# Patient Record
Sex: Female | Born: 1990 | Race: White | Hispanic: No | Marital: Single | Smoking: Current some day smoker
Health system: Southern US, Community
[De-identification: ages and names within clinical notes are randomized; demographics above are authoritative.]

## PROBLEM LIST (undated history)

## (undated) DIAGNOSIS — R000 Tachycardia, unspecified: Secondary | ICD-10-CM

## (undated) DIAGNOSIS — E0590 Thyrotoxicosis, unspecified without thyrotoxic crisis or storm: Secondary | ICD-10-CM

## (undated) DIAGNOSIS — N3010 Interstitial cystitis (chronic) without hematuria: Secondary | ICD-10-CM

## (undated) DIAGNOSIS — F410 Panic disorder [episodic paroxysmal anxiety] without agoraphobia: Secondary | ICD-10-CM

## (undated) DIAGNOSIS — R0602 Shortness of breath: Secondary | ICD-10-CM

## (undated) HISTORY — DX: Shortness of breath: R06.02

## (undated) HISTORY — PX: EXTERNAL EAR SURGERY: SHX627

## (undated) HISTORY — PX: BREAST ENHANCEMENT SURGERY: SHX7

## (undated) HISTORY — DX: Tachycardia, unspecified: R00.0

## (undated) HISTORY — PX: RHINOPLASTY: SHX2354

## (undated) HISTORY — DX: Thyrotoxicosis, unspecified without thyrotoxic crisis or storm: E05.90

## (undated) HISTORY — DX: Panic disorder (episodic paroxysmal anxiety): F41.0

---
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
## 2010-01-02 ENCOUNTER — Encounter (HOSPITAL_COMMUNITY): Admission: RE | Admit: 2010-01-02 | Discharge: 2010-04-02 | Payer: Self-pay | Admitting: Endocrinology

## 2011-08-07 LAB — RAPID STREP SCREEN (MED CTR MEBANE ONLY): Streptococcus, Group A Screen (Direct): NEGATIVE

## 2012-10-15 ENCOUNTER — Encounter: Payer: Self-pay | Admitting: Internal Medicine

## 2012-10-15 ENCOUNTER — Ambulatory Visit (INDEPENDENT_AMBULATORY_CARE_PROVIDER_SITE_OTHER): Payer: BC Managed Care – PPO | Admitting: Internal Medicine

## 2012-10-15 VITALS — BP 112/73 | HR 77 | Ht 66.0 in | Wt 141.1 lb

## 2012-10-15 DIAGNOSIS — R000 Tachycardia, unspecified: Secondary | ICD-10-CM

## 2012-10-15 DIAGNOSIS — F410 Panic disorder [episodic paroxysmal anxiety] without agoraphobia: Secondary | ICD-10-CM

## 2012-10-15 DIAGNOSIS — R002 Palpitations: Secondary | ICD-10-CM

## 2012-10-15 NOTE — Assessment & Plan Note (Signed)
The patient has symptoms of tachypalpitations which occur in the setting of emotional stress and anxiety primarily.  She typically feels that "panic" symptoms precede tachycardia.  Tachycardia is often of gradual offset.  These features point to sinus mechanism for her arrhythmia.  I do think however that we need to exclude arrhythmia.  She has no pre-excitation on her ekg today.  I will place a 30 day event monitor.  I will also obtain an echo to evaluate for structural heart disease.  If these are normal then no further testing is planned.

## 2012-10-15 NOTE — Assessment & Plan Note (Signed)
As above.

## 2012-10-15 NOTE — Assessment & Plan Note (Signed)
Will exclude arrhythmia as the cause (as above) She is instructed to follow-up with her PCP for management.   return in 6 weeks.

## 2012-10-15 NOTE — Patient Instructions (Signed)
Your physician recommends that you schedule a follow-up appointment in: 6 weeks with Dr Johney Frame  Your physician has requested that you have an echocardiogram. Echocardiography is a painless test that uses sound waves to create images of your heart. It provides your doctor with information about the size and shape of your heart and how well your heart's chambers and valves are working. This procedure takes approximately one hour. There are no restrictions for this procedure.  Your physician has recommended that you wear an event monitor. Event monitors are medical devices that record the heart's electrical activity. Doctors most often [place] these monitors to diagnose arrhythmias. Arrhythmias are problems with the speed or rhythm of the heartbeat. The monitor is a small, portable device. You can wear one while you do your normal daily activities. This is usually used to diagnose what is causing palpitations/syncope (passing out).  (LIFEWATCH -- 4 weeks)

## 2012-10-15 NOTE — Progress Notes (Signed)
Primary Care Physician: Dr Lavonna Monarch Referring Physician:  Dr Laurene Footman   Wendy Richards is a 21 y.o. female with a h/o tachycardia and "panic attacks" who presents today for cardiology consultation.  She reports having panic attacks since her teenage years.  She describes having a sensation of emotional lability with diaphoresis and nervousness.   She would then develop tachypalpitations.  She reports that episodes are most frequently brougth on by stress or being upset.  She finds that if she relaxes herself with deep breathing that the episode will resolve.  She reports gradual offset of tachycardia. She has not felt symptoms of tachycardia outside of the setting of panic. She reports rare associated chest pain. She exercises regularly without symptoms of exertional chest pain.  She does notice that her heart rate is occasionally fast during exercise and finds that if she stops that this gradually resolves. She denies symptoms of  shortness of breath, orthopnea, PND, lower extremity edema, dizziness, presyncope, syncope, or neurologic sequela. The patient is tolerating medications without difficulties and is otherwise without complaint today.   Past Medical History  Diagnosis Date  . Tachycardia   . Panic attacks   . SOB (shortness of breath)   . Hyperthyroidism    Past Surgical History  Procedure Date  . Breast enhancement surgery   . External ear surgery   . Rhinoplasty     Current Outpatient Prescriptions  Medication Sig Dispense Refill  . NON FORMULARY Birth Control        No Known Allergies  History   Social History  . Marital Status: Single    Spouse Name: N/A    Number of Children: N/A  . Years of Education: N/A   Occupational History  . Not on file.   Social History Main Topics  . Smoking status: Never Smoker   . Smokeless tobacco: Not on file  . Alcohol Use: Yes     Comment: moderate social drug  . Drug Use: No  . Sexually Active: Not on file   Other  Topics Concern  . 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 Hypertension Maternal Grandfather   . Cancer Maternal Grandfather     died of non hodgkins lymphoma  . Heart attack Paternal Grandfather   . Cancer Maternal Grandmother     uterine cancer    ROS- All systems are reviewed and negative except as per the HPI above  Physical Exam: Filed Vitals:   10/15/12 1515  BP: 112/73  Pulse: 77  Height: 5\' 6"  (1.676 m)  Weight: 141 lb 1.9 oz (64.012 kg)    GEN- The patient is well appearing, alert and oriented x 3 today.   Head- normocephalic, atraumatic Eyes-  Sclera clear, conjunctiva pink Ears- hearing intact Oropharynx- clear Neck- supple, no JVP Lymph- no cervical lymphadenopathy Lungs- Clear to ausculation bilaterally, normal work of breathing Heart- Regular rate and rhythm, no murmurs, rubs or gallops, PMI not laterally displaced GI- soft, NT, ND, + BS Extremities- no clubbing, cyanosis, or edema MS- no significant deformity or atrophy Skin- no rash or lesion Psych- euthymic mood, full affect Neuro- strength and sensation are intact  EKG today reveals sinus rhythm 77 bpm, pr 122, QRS 76 msec, Qtc 439, otherwise normal ekg Records from Dr [employer] office are reviewed  Assessment and Plan:

## 2012-10-27 ENCOUNTER — Encounter (INDEPENDENT_AMBULATORY_CARE_PROVIDER_SITE_OTHER): Payer: BC Managed Care – PPO

## 2012-10-27 ENCOUNTER — Telehealth: Payer: Self-pay | Admitting: *Deleted

## 2012-10-27 ENCOUNTER — Ambulatory Visit (HOSPITAL_COMMUNITY): Payer: BC Managed Care – PPO | Attending: Cardiology | Admitting: Radiology

## 2012-10-27 DIAGNOSIS — R0609 Other forms of dyspnea: Secondary | ICD-10-CM | POA: Insufficient documentation

## 2012-10-27 DIAGNOSIS — E0590 Thyrotoxicosis, unspecified without thyrotoxic crisis or storm: Secondary | ICD-10-CM | POA: Insufficient documentation

## 2012-10-27 DIAGNOSIS — R002 Palpitations: Secondary | ICD-10-CM

## 2012-10-27 DIAGNOSIS — R000 Tachycardia, unspecified: Secondary | ICD-10-CM

## 2012-10-27 DIAGNOSIS — R0989 Other specified symptoms and signs involving the circulatory and respiratory systems: Secondary | ICD-10-CM | POA: Insufficient documentation

## 2012-10-27 NOTE — Progress Notes (Signed)
Echocardiogram performed.  

## 2012-10-27 NOTE — Telephone Encounter (Signed)
AF express event monitor placed on Pt 10/27/12. TK

## 2012-12-08 ENCOUNTER — Encounter: Payer: Self-pay | Admitting: Internal Medicine

## 2012-12-08 ENCOUNTER — Ambulatory Visit (INDEPENDENT_AMBULATORY_CARE_PROVIDER_SITE_OTHER): Payer: BC Managed Care – PPO | Admitting: Internal Medicine

## 2012-12-08 ENCOUNTER — Other Ambulatory Visit: Payer: Self-pay | Admitting: Emergency Medicine

## 2012-12-08 VITALS — BP 109/71 | HR 82 | Ht 66.0 in | Wt 141.0 lb

## 2012-12-08 DIAGNOSIS — R002 Palpitations: Secondary | ICD-10-CM

## 2012-12-08 DIAGNOSIS — F410 Panic disorder [episodic paroxysmal anxiety] without agoraphobia: Secondary | ICD-10-CM

## 2012-12-08 DIAGNOSIS — R000 Tachycardia, unspecified: Secondary | ICD-10-CM

## 2012-12-08 MED ORDER — NADOLOL 20 MG PO TABS: ORAL_TABLET | ORAL | Status: DC

## 2012-12-08 NOTE — Assessment & Plan Note (Signed)
As above She will follow-up with Dr Evlyn Kanner

## 2012-12-08 NOTE — Progress Notes (Signed)
PCP: Dr Baldwin [place] is a 22 y.o. female who presents today for routine electrophysiology followup.  Since last being seen in our [facility], the patient reports doing reasonbaly well.  She describes a high level of anxiety and frequent panic attacks.  She finds that she is easily stressed out about school.  She feels that her tachypalpitations typically occur with stress.  She occasionally wakes with tachypalpitations which gradually resolve with time.  Today, she denies symptoms of chest pain, shortness of breath,  lower extremity edema, dizziness, presyncope, or syncope.  The patient is otherwise without complaint today.   Past Medical History  Diagnosis Date  . Tachycardia   . Panic attacks   . SOB (shortness of breath)   . Hyperthyroidism    Past Surgical History  Procedure Date  . Breast enhancement surgery   . External ear surgery   . Rhinoplasty     Current Outpatient Prescriptions  Medication Sig Dispense Refill  . levonorgestrel-ethinyl estradiol (SEASONALE,INTROVALE,JOLESSA) 0.15-0.03 MG tablet       . nadolol (CORGARD) 20 MG tablet Take 1/2 tablet daily  30 tablet  0    Physical Exam: Filed Vitals:   12/08/12 1502  BP: 109/71  Pulse: 82  Height: 5\' 6"  (1.676 m)  Weight: 141 lb (63.957 kg)    GEN- The patient is well appearing, alert and oriented x 3 today.   Head- normocephalic, atraumatic Eyes-  Sclera clear, conjunctiva pink Ears- hearing intact Oropharynx- clear Lungs- Clear to ausculation bilaterally, normal work of breathing Heart- Regular rate and rhythm, no murmurs, rubs or gallops, PMI not laterally displaced GI- soft, NT, ND, + BS Extremities- no clubbing, cyanosis, or edema  Event monitor is reviewed.  This reveals sinus rhythm.  Symptomatic transmissions correspond to sinus tachycardia.  Assessment and Plan:

## 2012-12-08 NOTE — Patient Instructions (Addendum)
Your physician recommends that you schedule a follow-up appointment as needed with Dr Ladona Ridgel  Your physician has recommended you make the following change in your medication:  1) Start Nadolol 10mg  as needed--1/2 of a 20mg  tablet as needed

## 2012-12-08 NOTE — Assessment & Plan Note (Signed)
The patients tachycardia/ palpitations appear to be predominantly due to sinus tachycardia.  Though I cannot completely exclude SVT, this seems unlikely particularly given her overall anxiety syndrome.  At this point, she and I would both favor medical therapy directed at anxiety and then to reassess her tachycardia.  I think that she should follow-up with Dr Evlyn Kanner to consider initiation of perhaps an SSRI.  I think that she should avoid short acting benzodiazepines given their potentially addictive properties and her young age. I did give a prescription for nadolol 10mg  daily which she can take if her sinus tachycardia worsens. She would prefer to see me as needed going forward.

## 2013-06-27 ENCOUNTER — Encounter (HOSPITAL_COMMUNITY): Payer: Self-pay | Admitting: *Deleted

## 2013-06-27 ENCOUNTER — Emergency Department (HOSPITAL_COMMUNITY)
Admission: EM | Admit: 2013-06-27 | Discharge: 2013-06-27 | Disposition: A | Discharge status: Home / Self Care | Payer: BC Managed Care – PPO | Attending: Emergency Medicine | Admitting: Emergency Medicine

## 2013-06-27 DIAGNOSIS — R109 Unspecified abdominal pain: Secondary | ICD-10-CM

## 2013-06-27 DIAGNOSIS — Z3202 Encounter for pregnancy test, result negative: Secondary | ICD-10-CM | POA: Insufficient documentation

## 2013-06-27 DIAGNOSIS — Z8639 Personal history of other endocrine, nutritional and metabolic disease: Secondary | ICD-10-CM | POA: Insufficient documentation

## 2013-06-27 DIAGNOSIS — R300 Dysuria: Secondary | ICD-10-CM | POA: Insufficient documentation

## 2013-06-27 DIAGNOSIS — Z87442 Personal history of urinary calculi: Secondary | ICD-10-CM | POA: Insufficient documentation

## 2013-06-27 DIAGNOSIS — Z862 Personal history of diseases of the blood and blood-forming organs and certain disorders involving the immune mechanism: Secondary | ICD-10-CM | POA: Insufficient documentation

## 2013-06-27 DIAGNOSIS — R110 Nausea: Secondary | ICD-10-CM | POA: Insufficient documentation

## 2013-06-27 DIAGNOSIS — IMO0002 Reserved for concepts with insufficient information to code with codable children: Secondary | ICD-10-CM | POA: Insufficient documentation

## 2013-06-27 DIAGNOSIS — Z8659 Personal history of other mental and behavioral disorders: Secondary | ICD-10-CM | POA: Insufficient documentation

## 2013-06-27 DIAGNOSIS — Z792 Long term (current) use of antibiotics: Secondary | ICD-10-CM | POA: Insufficient documentation

## 2013-06-27 DIAGNOSIS — R350 Frequency of micturition: Secondary | ICD-10-CM | POA: Insufficient documentation

## 2013-06-27 DIAGNOSIS — N390 Urinary tract infection, site not specified: Secondary | ICD-10-CM | POA: Insufficient documentation

## 2013-06-27 DIAGNOSIS — R1032 Left lower quadrant pain: Secondary | ICD-10-CM | POA: Insufficient documentation

## 2013-06-27 DIAGNOSIS — Z79899 Other long term (current) drug therapy: Secondary | ICD-10-CM | POA: Insufficient documentation

## 2013-06-27 LAB — URINE MICROSCOPIC-ADD ON

## 2013-06-27 LAB — URINALYSIS, ROUTINE W REFLEX MICROSCOPIC
Bilirubin Urine: NEGATIVE
Glucose, UA: NEGATIVE mg/dL
Hgb urine dipstick: NEGATIVE
Ketones, ur: NEGATIVE mg/dL
Nitrite: POSITIVE — AB
Protein, ur: NEGATIVE mg/dL
Specific Gravity, Urine: 1.026 (ref 1.005–1.030)
Urobilinogen, UA: 1 mg/dL (ref 0.0–1.0)
pH: 7 (ref 5.0–8.0)

## 2013-06-27 LAB — CBC WITH DIFFERENTIAL/PLATELET
Basophils Absolute: 0 10*3/uL (ref 0.0–0.1)
Basophils Relative: 0 % (ref 0–1)
Eosinophils Absolute: 0.1 10*3/uL (ref 0.0–0.7)
Eosinophils Relative: 1 % (ref 0–5)
HCT: 42.7 % (ref 36.0–46.0)
Hemoglobin: 14.8 g/dL (ref 12.0–15.0)
Lymphocytes Relative: 48 % — ABNORMAL HIGH (ref 12–46)
Lymphs Abs: 4.4 10*3/uL — ABNORMAL HIGH (ref 0.7–4.0)
MCH: 30.6 pg (ref 26.0–34.0)
MCHC: 34.7 g/dL (ref 30.0–36.0)
MCV: 88.2 fL (ref 78.0–100.0)
Monocytes Absolute: 0.7 10*3/uL (ref 0.1–1.0)
Monocytes Relative: 7 % (ref 3–12)
Neutro Abs: 4 10*3/uL (ref 1.7–7.7)
Neutrophils Relative %: 43 % (ref 43–77)
Platelets: 345 10*3/uL (ref 150–400)
RBC: 4.84 MIL/uL (ref 3.87–5.11)
RDW: 12.1 % (ref 11.5–15.5)
WBC: 9.1 10*3/uL (ref 4.0–10.5)

## 2013-06-27 LAB — POCT I-STAT, CHEM 8
BUN: 8 mg/dL (ref 6–23)
Calcium, Ion: 1.14 mmol/L (ref 1.12–1.23)
Chloride: 102 mEq/L (ref 96–112)
Creatinine, Ser: 1 mg/dL (ref 0.50–1.10)
Glucose, Bld: 83 mg/dL (ref 70–99)
HCT: 46 % (ref 36.0–46.0)
Hemoglobin: 15.6 g/dL — ABNORMAL HIGH (ref 12.0–15.0)
Potassium: 3.3 mEq/L — ABNORMAL LOW (ref 3.5–5.1)
Sodium: 139 mEq/L (ref 135–145)
TCO2: 23 mmol/L (ref 0–100)

## 2013-06-27 LAB — POCT PREGNANCY, URINE: Preg Test, Ur: NEGATIVE

## 2013-06-27 MED ORDER — SODIUM CHLORIDE 0.9 % IV SOLN
Freq: Once | INTRAVENOUS | Status: AC
  Administered 2013-06-27: 06:00:00 via INTRAVENOUS

## 2013-06-27 MED ORDER — MORPHINE SULFATE 4 MG/ML IJ SOLN
4.0000 mg | Freq: Once | INTRAMUSCULAR | Status: AC
  Administered 2013-06-27: 4 mg via INTRAVENOUS
  Filled 2013-06-27: qty 1

## 2013-06-27 MED ORDER — NITROFURANTOIN MONOHYD MACRO 100 MG PO CAPS: 100.0000 mg | ORAL_CAPSULE | Freq: Two times a day (BID) | ORAL | Status: DC

## 2013-06-27 MED ORDER — ONDANSETRON HCL 4 MG/2ML IJ SOLN
4.0000 mg | Freq: Once | INTRAMUSCULAR | Status: AC
  Administered 2013-06-27: 4 mg via INTRAVENOUS
  Filled 2013-06-27: qty 2

## 2013-06-27 MED ORDER — LIDOCAINE HCL 2 % EX GEL
Freq: Once | CUTANEOUS | Status: AC
  Administered 2013-06-27: 10 via URETHRAL
  Filled 2013-06-27: qty 10

## 2013-06-27 MED ORDER — PHENAZOPYRIDINE HCL 200 MG PO TABS: 200.0000 mg | ORAL_TABLET | Freq: Three times a day (TID) | ORAL | Status: DC | PRN

## 2013-06-27 NOTE — ED Provider Notes (Signed)
CSN: [id number]     Arrival date & time 06/27/13  0419 History     First MD Initiated Contact with Patient 06/27/13 0434     Chief Complaint  Patient presents with  . Flank Pain   (Consider location/radiation/quality/duration/timing/severity/associated sxs/prior Treatment) HPI Comments: Vision states, that for the last 25 days.  She has had persistent left flank pain, and left lower quadrant pain.  She has seen her OB/GYN on several occasions.  She has had pelvic exams.  Bedside transvaginal ultrasound, and urine testing, all within normal limits.  She has been placed on antibiotics, and and pain medication without any relief of her symptoms.  She has been taking some leftover Pyridium from her father, who had a kidney stone at one point  Patient is a 22 y.o. female presenting with flank pain. The history is provided by the patient.  Flank Pain This is a new problem. The current episode started 1 to 4 weeks ago. The problem occurs constantly. The problem has been gradually worsening. Associated symptoms include abdominal pain, nausea and urinary symptoms. Pertinent negatives include no change in bowel habit, chest pain, chills, congestion, coughing, fatigue, fever, headaches, joint swelling, myalgias, neck pain, rash, vertigo or vomiting. Nothing aggravates the symptoms. She has tried NSAIDs and oral narcotics for the symptoms. The treatment provided no relief.    Past Medical History  Diagnosis Date  . Tachycardia   . Panic attacks   . SOB (shortness of breath)   . Hyperthyroidism    Past Surgical History  Procedure Laterality Date  . Breast enhancement surgery    . External ear surgery    . Rhinoplasty     Family History  Problem Relation Age of Onset  . Hypertension Maternal Grandfather   . Cancer Maternal Grandfather     died of non hodgkins lymphoma  . Heart attack Paternal Grandfather   . Cancer Maternal Grandmother     uterine cancer   History  Substance Use Topics  .  Smoking status: Never Smoker   . Smokeless tobacco: Not on file  . Alcohol Use: Yes     Comment: moderate social drug   OB History   Grav Para Term Preterm Abortions TAB SAB Ect Mult Living                 Review of Systems  Constitutional: Negative for fever, chills and fatigue.  HENT: Negative for congestion and neck pain.   Respiratory: Negative for cough and shortness of breath.   Cardiovascular: Negative for chest pain.  Gastrointestinal: Positive for nausea and abdominal pain. Negative for vomiting and change in bowel habit.  Genitourinary: Positive for dysuria, frequency and flank pain. Negative for urgency, hematuria, decreased urine volume, vaginal bleeding, vaginal discharge, menstrual problem and pelvic pain.  Musculoskeletal: Negative for myalgias and joint swelling.  Skin: Negative for rash.  Neurological: Negative for vertigo and headaches.    Allergies  Review of patient's allergies indicates no known allergies.  Home Medications   Current Outpatient Rx  Name  Route  Sig  Dispense  Refill  . amitriptyline (ELAVIL) 25 MG tablet   Oral   Take 25 mg by mouth at bedtime as needed for sleep.         . ciprofloxacin (CIPRO) 500 MG tablet   Oral   Take 500 mg by mouth 2 (two) times daily.         Marland Kitchen levonorgestrel-ethinyl estradiol (SEASONALE,INTROVALE,JOLESSA) 0.15-0.03 MG tablet   Oral  Take 1 tablet by mouth every morning.          Marland Kitchen oxyCODONE-acetaminophen (PERCOCET/ROXICET) 5-325 MG per tablet   Oral   Take 1 tablet by mouth every 6 (six) hours as needed for pain.         . phenazopyridine (PYRIDIUM) 100 MG tablet   Oral   Take 100 mg by mouth daily as needed for pain.          BP 143/93  Pulse 119  Temp(Src) 99.7 F (37.6 C) (Oral)  Resp 20  SpO2 100%  LMP 05/06/2013 Physical Exam  Vitals reviewed. Constitutional: She is oriented to person, place, and time. She appears well-developed and well-nourished.  HENT:  Head: Normocephalic.   Eyes: Pupils are equal, round, and reactive to light.  Neck: Normal range of motion.  Cardiovascular: Normal rate and regular rhythm.   Pulmonary/Chest: Effort normal and breath sounds normal.  Abdominal: Soft. Bowel sounds are normal. She exhibits no distension. There is tenderness in the suprapubic area. There is no rebound.  Musculoskeletal: Normal range of motion.  Neurological: She is alert and oriented to person, place, and time.  Skin: Skin is warm and dry. No rash noted. No pallor.    ED Course   Procedures (including critical care time)  Emergency Focused Ultrasound Exam Limited retroperitoneal ultrasound of kidneys  Performed and interpreted by Dr. Jodi Mourning Indication: flank pain Focused abdominal ultrasound with both kidneys imaged in transverse and longitudinal planes in real-time. Interpretation: no hydronephrosis visualized.  No stones or cysts visualized  Images archived electronically   Labs Reviewed  CBC WITH DIFFERENTIAL - Abnormal; Notable for the following:    Lymphocytes Relative 48 (*)    Lymphs Abs 4.4 (*)    All other components within normal limits  POCT I-STAT, CHEM 8 - Abnormal; Notable for the following:    Potassium 3.3 (*)    Hemoglobin 15.6 (*)    All other components within normal limits  URINALYSIS, ROUTINE W REFLEX MICROSCOPIC  POCT PREGNANCY, URINE   No results found. No diagnosis found.  MDM     Arman Filter, NP 06/27/13 [phone]  Medical screening examination/treatment/procedure(s) were conducted as a shared visit with non-physician practitioner(s) or resident  and myself.  I personally evaluated the patient during the encounter and agree with the findings and plan unless otherwise indicated.    Recurrent left flank pain and dysuria.  Pt has seen ob gyn, has had pelvic US.  No known cause.  Possible interstitial cystitis.  No hematuria or kidney stone hx.  Constant pain. Abd soft, nd, mild tender left lower quadrant and suprapubic.   Well appearing in ED. Bedside US no hydronephrosis.  Close fup with pcp and urology recommended.    Enid Skeens, MD 06/27/13 [phone]

## 2013-06-27 NOTE — ED Notes (Signed)
Pt states she has had left flank pain for a month and is on percocet and the urologist can't tell her what is wrong,  Pt is tearful and states it also hurts to urinate, and the pain is unbearable,  Pt is alert and oriented, her mom is at bedside

## 2013-06-27 NOTE — ED Provider Notes (Signed)
6:09 AM Assumed care of patient at change of shift from Earley Favor, NP.  Patient with left flank pain and dysuria for 25 days, is followed by obgyn with several prior workups.  Plan is for UA, bedside ultrasound by Dr Jodi Mourning.  If UA is negative, plan for urine culture and urology follow up, d/c home.   7:19 AM Reviewed UA results with Dr Jodi Mourning and discussed plan.  Will put patient on macrobid, urology follow up.  Urine culture pending.  Pt d/c home.  Discussed all results with patient.  Pt given return precautions.  Pt verbalizes understanding and agrees with plan.     Filed Vitals:   06/27/13 0505  BP:   Pulse:   Temp: 99.7 F (37.6 C)  Resp:      Results for orders placed during the hospital encounter of 06/27/13  CBC WITH DIFFERENTIAL      Result Value Range   WBC 9.1  4.0 - 10.5 K/uL   RBC 4.84  3.87 - 5.11 MIL/uL   Hemoglobin 14.8  12.0 - 15.0 g/dL   HCT 16.1  09.6 - 04.5 %   MCV 88.2  78.0 - 100.0 fL   MCH 30.6  26.0 - 34.0 pg   MCHC 34.7  30.0 - 36.0 g/dL   RDW 40.9  81.1 - 91.4 %   Platelets 345  150 - 400 K/uL   Neutrophils Relative % 43  43 - 77 %   Neutro Abs 4.0  1.7 - 7.7 K/uL   Lymphocytes Relative 48 (*) 12 - 46 %   Lymphs Abs 4.4 (*) 0.7 - 4.0 K/uL   Monocytes Relative 7  3 - 12 %   Monocytes Absolute 0.7  0.1 - 1.0 K/uL   Eosinophils Relative 1  0 - 5 %   Eosinophils Absolute 0.1  0.0 - 0.7 K/uL   Basophils Relative 0  0 - 1 %   Basophils Absolute 0.0  0.0 - 0.1 K/uL  URINALYSIS, ROUTINE W REFLEX MICROSCOPIC      Result Value Range   Color, Urine ORANGE (*) YELLOW   APPearance CLEAR  CLEAR   Specific Gravity, Urine 1.026  1.005 - 1.030   pH 7.0  5.0 - 8.0   Glucose, UA NEGATIVE  NEGATIVE mg/dL   Hgb urine dipstick NEGATIVE  NEGATIVE   Bilirubin Urine NEGATIVE  NEGATIVE   Ketones, ur NEGATIVE  NEGATIVE mg/dL   Protein, ur NEGATIVE  NEGATIVE mg/dL   Urobilinogen, UA 1.0  0.0 - 1.0 mg/dL   Nitrite POSITIVE (*) NEGATIVE   Leukocytes, UA SMALL (*)  NEGATIVE  URINE MICROSCOPIC-ADD ON      Result Value Range   WBC, UA 0-2  <3 WBC/hpf   RBC / HPF 3-6  <3 RBC/hpf   Bacteria, UA FEW (*) RARE   Urine-Other MUCOUS PRESENT    POCT I-STAT, CHEM 8      Result Value Range   Sodium 139  135 - 145 mEq/L   Potassium 3.3 (*) 3.5 - 5.1 mEq/L   Chloride 102  96 - 112 mEq/L   BUN 8  6 - 23 mg/dL   Creatinine, Ser 7.82  0.50 - 1.10 mg/dL   Glucose, Bld 83  70 - 99 mg/dL   Calcium, Ion 9.56  2.13 - 1.23 mmol/L   TCO2 23  0 - 100 mmol/L   Hemoglobin 15.6 (*) 12.0 - 15.0 g/dL   HCT 08.6  57.8 - 46.9 %  POCT PREGNANCY,  URINE      Result Value Range   Preg Test, Ur NEGATIVE  NEGATIVE   No results found.    [place], PA-C 06/27/13 0740

## 2013-06-28 LAB — URINE CULTURE
Colony Count: NO GROWTH
Culture: NO GROWTH

## 2013-08-03 ENCOUNTER — Other Ambulatory Visit: Payer: Self-pay | Admitting: Urology

## 2013-08-03 DIAGNOSIS — R109 Unspecified abdominal pain: Secondary | ICD-10-CM

## 2013-08-19 ENCOUNTER — Ambulatory Visit
Admission: RE | Admit: 2013-08-19 | Discharge: 2013-08-19 | Disposition: A | Discharge status: Home / Self Care | Payer: BC Managed Care – PPO | Source: Ambulatory Visit | Attending: Urology | Admitting: Urology

## 2013-08-19 DIAGNOSIS — R109 Unspecified abdominal pain: Secondary | ICD-10-CM

## 2014-07-23 ENCOUNTER — Inpatient Hospital Stay (HOSPITAL_COMMUNITY)
Admission: AD | Admit: 2014-07-23 | Discharge: 2014-07-23 | Discharge status: Against Medical Advice | Payer: BC Managed Care – PPO | Source: Ambulatory Visit | Attending: Obstetrics and Gynecology | Admitting: Obstetrics and Gynecology

## 2014-07-23 DIAGNOSIS — N390 Urinary tract infection, site not specified: Secondary | ICD-10-CM | POA: Diagnosis not present

## 2014-07-23 DIAGNOSIS — N949 Unspecified condition associated with female genital organs and menstrual cycle: Secondary | ICD-10-CM | POA: Insufficient documentation

## 2014-07-23 NOTE — MAU Note (Signed)
Pt told registration clerk she was leaving because the wait was so long. Registration clerk told pt she needed to sign AMA paper, however pt stated she didn't have time for that and left.

## 2014-07-23 NOTE — MAU Note (Signed)
Patient states she has IC and has been in pain for weeks. Has a UTI and been on antibiotics/ Has pain on the vulva and feels like a cyst "whte bulging" on the right side.

## 2014-07-24 ENCOUNTER — Inpatient Hospital Stay (HOSPITAL_COMMUNITY)
Admission: AD | Admit: 2014-07-24 | Discharge: 2014-07-24 | Disposition: A | Discharge status: Home / Self Care | Payer: BC Managed Care – PPO | Source: Ambulatory Visit | Attending: Obstetrics and Gynecology | Admitting: Obstetrics and Gynecology

## 2014-07-24 ENCOUNTER — Encounter (HOSPITAL_COMMUNITY): Payer: Self-pay | Admitting: *Deleted

## 2014-07-24 DIAGNOSIS — B373 Candidiasis of vulva and vagina: Secondary | ICD-10-CM | POA: Diagnosis not present

## 2014-07-24 DIAGNOSIS — B3731 Acute candidiasis of vulva and vagina: Secondary | ICD-10-CM

## 2014-07-24 DIAGNOSIS — N898 Other specified noninflammatory disorders of vagina: Secondary | ICD-10-CM | POA: Diagnosis present

## 2014-07-24 DIAGNOSIS — Z3202 Encounter for pregnancy test, result negative: Secondary | ICD-10-CM | POA: Diagnosis not present

## 2014-07-24 DIAGNOSIS — M545 Low back pain, unspecified: Secondary | ICD-10-CM | POA: Diagnosis not present

## 2014-07-24 LAB — CBC
HCT: 39.1 % (ref 36.0–46.0)
Hemoglobin: 13.4 g/dL (ref 12.0–15.0)
MCH: 31 pg (ref 26.0–34.0)
MCHC: 34.3 g/dL (ref 30.0–36.0)
MCV: 90.5 fL (ref 78.0–100.0)
Platelets: 282 10*3/uL (ref 150–400)
RBC: 4.32 MIL/uL (ref 3.87–5.11)
RDW: 12.6 % (ref 11.5–15.5)
WBC: 7.8 10*3/uL (ref 4.0–10.5)

## 2014-07-24 LAB — HCG, QUANTITATIVE, PREGNANCY: hCG, Beta Chain, Quant, S: <1 m[IU]/mL (ref <5)

## 2014-07-24 LAB — URINALYSIS, ROUTINE W REFLEX MICROSCOPIC
Bilirubin Urine: NEGATIVE
Glucose, UA: NEGATIVE mg/dL
Ketones, ur: NEGATIVE mg/dL
Leukocytes, UA: NEGATIVE
Nitrite: NEGATIVE
Protein, ur: NEGATIVE mg/dL
Specific Gravity, Urine: >1.03 — ABNORMAL HIGH (ref 1.005–1.030)
Urobilinogen, UA: 0.2 mg/dL (ref 0.0–1.0)
pH: 6 (ref 5.0–8.0)

## 2014-07-24 LAB — URINE MICROSCOPIC-ADD ON

## 2014-07-24 LAB — WET PREP, GENITAL
Clue Cells Wet Prep HPF POC: NONE SEEN
Trich, Wet Prep: NONE SEEN
Yeast Wet Prep HPF POC: NONE SEEN

## 2014-07-24 LAB — POCT PREGNANCY, URINE: Preg Test, Ur: POSITIVE — AB

## 2014-07-24 MED ORDER — TRAMADOL HCL 50 MG PO TABS: 50.0000 mg | ORAL_TABLET | Freq: Four times a day (QID) | ORAL | Status: AC | PRN

## 2014-07-24 MED ORDER — FLUCONAZOLE 150 MG PO TABS: 150.0000 mg | ORAL_TABLET | Freq: Once | ORAL | Status: AC

## 2014-07-24 MED ORDER — TRIAMCINOLONE ACETONIDE 0.1 % EX CREA
1.0000 | TOPICAL_CREAM | Freq: Two times a day (BID) | CUTANEOUS | Status: DC | PRN
Start: 2014-07-24 — End: 2015-12-16

## 2014-07-24 MED ORDER — OXYCODONE-ACETAMINOPHEN 5-325 MG PO TABS
2.0000 | ORAL_TABLET | Freq: Once | ORAL | Status: AC
  Administered 2014-07-24: 2 via ORAL
  Filled 2014-07-24: qty 2

## 2014-07-24 NOTE — MAU Provider Note (Signed)
Chief Complaint: Cystitis   First Provider Initiated Contact with Patient 07/24/14 2014     SUBJECTIVE HPI: Wendy Richards is a 23 y.o. G0P0 female who presents with vaginal discharge and severe vaginal irritation x2-3 days. On Bactrim for 3 days for UTI. She might have a yeast infection and started taking Monistat 7, but vaginal irritation got much worse. Also reports low back pain and mild suprapubic tenderness, but states this is identical to her usual interstitial cystitis pain. Patient states she is in a mutually monogamous relationship takes birth control pills one time.  Sees Dr. Radene Knee for routine gynecology care, but states symptoms were too severe to wait for office visit.  Past Medical History  Diagnosis Date  . Tachycardia   . Panic attacks   . SOB (shortness of breath)   . Hyperthyroidism    OB History  Gravida Para Term Preterm AB SAB TAB Ectopic Multiple Living  0 0        0       Past Surgical History  Procedure Laterality Date  . Breast enhancement surgery    . External ear surgery    . Rhinoplasty     History   Social History  . Marital Status: Single    Spouse Name: N/A    Number of Children: N/A  . Years of Education: N/A   Occupational History  . Not on file.   Social History Main Topics  . Smoking status: Never Smoker   . Smokeless tobacco: Never Used  . Alcohol Use: Yes     Comment: moderate social drug  . Drug Use: No  . Sexual Activity: Yes    Birth Control/ Protection: Pill   Other Topics Concern  . Not on file   Social History Narrative   Pt lives in [place] with her mother.   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   Current Outpatient Prescriptions on File Prior to Encounter  Medication Sig Dispense Refill  . amitriptyline (ELAVIL) 25 MG tablet Take 25 mg by mouth at bedtime as needed for sleep.      Marland Kitchen levonorgestrel-ethinyl estradiol  (SEASONALE,INTROVALE,JOLESSA) 0.15-0.03 MG tablet Take 1 tablet by mouth every morning.        No Known Allergies  ROS: Pertinent positive items in HPI. Mild nausea which she attributes to antibiotics. Constipation which she contributes to Vicodin that she is taking for interstitial cystitis. Last bowel movement yesterday. Reports vaginal pain with intercourse since other symptoms started, but denies deep pain. Negative for fever, chills, vomiting, diarrhea, inter-menstrual bleeding.  OBJECTIVE Blood pressure 120/91, pulse 87, temperature 99.1 F (37.3 C), temperature source Oral, resp. rate 18, height _0  (1.676 m), weight 62.415 kg (137 lb 9.6 oz), last menstrual period 07/09/2014. GENERAL: Well-developed, well-nourished female in no acute distress.  HEENT: Normocephalic HEART: normal rate RESP: normal effort ABDOMEN: Soft, non-tender. Positive bowel sounds x4. Mild sacral tenderness. No CVA tenderness. EXTREMITIES: Nontender, no edema NEURO: Alert and oriented SPECULUM EXAM: NEFG except for bilateral vulvar erythema, moderate amount of mixed curd-like and creamy, mildly malodorous white discharge, no blood noted initially, but cervix friable with exam. No vaginal or cervical lesions.  BIMANUAL: cervix closed; uterus normal size, no adnexal tenderness or masses. No cervical motion tenderness.  LAB RESULTS Results for orders placed during the hospital encounter of 07/24/14 (from the past 24 hour(s))  URINALYSIS, ROUTINE W REFLEX MICROSCOPIC  Status: Abnormal   Collection Time    07/24/14  6:55 PM      Result Value Ref Range   Color, Urine YELLOW  YELLOW   APPearance CLEAR  CLEAR   Specific Gravity, Urine >1.030 (*) 1.005 - 1.030   pH 6.0  5.0 - 8.0   Glucose, UA NEGATIVE  NEGATIVE mg/dL   Hgb urine dipstick SMALL (*) NEGATIVE   Bilirubin Urine NEGATIVE  NEGATIVE   Ketones, ur NEGATIVE  NEGATIVE mg/dL   Protein, ur NEGATIVE  NEGATIVE mg/dL   Urobilinogen, UA 0.2  0.0 - 1.0  mg/dL   Nitrite NEGATIVE  NEGATIVE   Leukocytes, UA NEGATIVE  NEGATIVE  URINE MICROSCOPIC-ADD ON     Status: Abnormal   Collection Time    07/24/14  6:55 PM      Result Value Ref Range   Squamous Epithelial / LPF FEW (*) RARE   WBC, UA 0-2  <3 WBC/hpf   RBC / HPF 0-2  <3 RBC/hpf   Bacteria, UA FEW (*) RARE  POCT PREGNANCY, URINE     Status: Abnormal   Collection Time    07/24/14  7:18 PM      Result Value Ref Range   Preg Test, Ur POSITIVE (*) NEGATIVE  HCG, QUANTITATIVE, PREGNANCY     Status: None   Collection Time    07/24/14  8:40 PM      Result Value Ref Range   hCG, Beta Chain, Quant, S <1  <5 mIU/mL  ABO/RH     Status: None   Collection Time    07/24/14  8:40 PM      Result Value Ref Range   ABO/RH(D) A POS    CBC     Status: None   Collection Time    07/24/14  8:40 PM      Result Value Ref Range   WBC 7.8  4.0 - 10.5 K/uL   RBC 4.32  3.87 - 5.11 MIL/uL   Hemoglobin 13.4  12.0 - 15.0 g/dL   HCT 39.1  36.0 - 46.0 %   MCV 90.5  78.0 - 100.0 fL   MCH 31.0  26.0 - 34.0 pg   MCHC 34.3  30.0 - 36.0 g/dL   RDW 12.6  11.5 - 15.5 %   Platelets 282  150 - 400 K/uL  WET PREP, GENITAL     Status: Abnormal   Collection Time    07/24/14 10:00 PM      Result Value Ref Range   Yeast Wet Prep HPF POC NONE SEEN  NONE SEEN   Trich, Wet Prep NONE SEEN  NONE SEEN   Clue Cells Wet Prep HPF POC NONE SEEN  NONE SEEN   WBC, Wet Prep HPF POC FEW (*) NONE SEEN    IMAGING No results found.  MAU COURSE Faint positive UPT. Quant ordered>> negative   ASSESSMENT 1. Vaginal yeast infection   2. Negative pregnancy test     PLAN Discharge home in stable condition. GC/Chlamydia pending. No intercourse until symptoms resolve.  Follow-up Information   Follow up with [facility] S, MD. (As needed if symptoms do not improve after medication completed)    Specialty:  Obstetrics and Gynecology   Contact information:   [address], [address] [place], [place] [address] [phone]       Follow up with [facility]. (As needed in emergencies)    Contact information:  [address] [id number] [place] [address] [phone]        Medication List    STOP taking these medications       miconazole Kit  Commonly known as:  MONISTAT-3      TAKE these medications       amitriptyline 25 MG tablet  Commonly known as:  ELAVIL  Take 25 mg by mouth at bedtime as needed for sleep.     diazepam 5 MG tablet  Commonly known as:  VALIUM  Take 5 mg by mouth every 6 (six) hours as needed for anxiety.     fluconazole 150 MG tablet  Commonly known as:  DIFLUCAN  Take 1 tablet (150 mg total) by mouth once. If no improvement in 3 days take a second tablet.     HYDROcodone-acetaminophen 5-325 MG per tablet  Commonly known as:  NORCO/VICODIN  Take 1 tablet by mouth every 4 (four) hours as needed for moderate pain.     levonorgestrel-ethinyl estradiol 0.15-0.03 MG tablet  Commonly known as:  SEASONALE,INTROVALE,JOLESSA  Take 1 tablet by mouth every morning.     sulfamethoxazole-trimethoprim 800-160 MG per tablet  Commonly known as:  BACTRIM DS  Take 1 tablet by mouth 2 (two) times daily.     triamcinolone cream 0.1 %  Commonly known as:  KENALOG  Apply 1 application topically 2 (two) times daily as needed.     VESICARE 10 MG tablet  Generic drug:  solifenacin  Take 10 mg by mouth daily.        [place], [place] 07/24/2014  8:38 PM

## 2014-07-24 NOTE — MAU Note (Signed)
Dx with Intersititial cystitis, finsihed medications.  Started havign some other pain not related to IC.  Has been doing Yeast infection cream x2 but this has made it worse.  Looked vaginal noticed "something" on right inner labia looked it up and thinks it may be a batho cyst or could be BV.

## 2014-07-24 NOTE — Discharge Instructions (Signed)

## 2014-07-25 LAB — ABO/RH: ABO/RH(D): A POS

## 2014-07-25 LAB — HIV ANTIBODY (ROUTINE TESTING W REFLEX): HIV 1&2 Ab, 4th Generation: NONREACTIVE

## 2014-07-26 LAB — GC/CHLAMYDIA PROBE AMP
CT Probe RNA: NEGATIVE
GC Probe RNA: NEGATIVE

## 2014-12-27 ENCOUNTER — Emergency Department (HOSPITAL_COMMUNITY)
Admission: EM | Admit: 2014-12-27 | Discharge: 2014-12-27 | Disposition: A | Discharge status: Home / Self Care | Payer: BLUE CROSS/BLUE SHIELD | Attending: Emergency Medicine | Admitting: Emergency Medicine

## 2014-12-27 ENCOUNTER — Encounter (HOSPITAL_COMMUNITY): Payer: Self-pay

## 2014-12-27 DIAGNOSIS — R319 Hematuria, unspecified: Secondary | ICD-10-CM | POA: Diagnosis present

## 2014-12-27 DIAGNOSIS — R079 Chest pain, unspecified: Secondary | ICD-10-CM | POA: Diagnosis not present

## 2014-12-27 DIAGNOSIS — N390 Urinary tract infection, site not specified: Secondary | ICD-10-CM

## 2014-12-27 DIAGNOSIS — Z8659 Personal history of other mental and behavioral disorders: Secondary | ICD-10-CM | POA: Insufficient documentation

## 2014-12-27 DIAGNOSIS — Z3202 Encounter for pregnancy test, result negative: Secondary | ICD-10-CM | POA: Diagnosis not present

## 2014-12-27 DIAGNOSIS — Z7982 Long term (current) use of aspirin: Secondary | ICD-10-CM | POA: Diagnosis not present

## 2014-12-27 DIAGNOSIS — Z7952 Long term (current) use of systemic steroids: Secondary | ICD-10-CM | POA: Diagnosis not present

## 2014-12-27 DIAGNOSIS — Z8639 Personal history of other endocrine, nutritional and metabolic disease: Secondary | ICD-10-CM | POA: Diagnosis not present

## 2014-12-27 DIAGNOSIS — R0602 Shortness of breath: Secondary | ICD-10-CM | POA: Insufficient documentation

## 2014-12-27 HISTORY — DX: Interstitial cystitis (chronic) without hematuria: N30.10

## 2014-12-27 LAB — CBC WITH DIFFERENTIAL/PLATELET
Basophils Absolute: 0 10*3/uL (ref 0.0–0.1)
Basophils Relative: 0 % (ref 0–1)
Eosinophils Absolute: 0.1 10*3/uL (ref 0.0–0.7)
Eosinophils Relative: 1 % (ref 0–5)
HCT: 40.2 % (ref 36.0–46.0)
Hemoglobin: 13.5 g/dL (ref 12.0–15.0)
Lymphocytes Relative: 33 % (ref 12–46)
Lymphs Abs: 2.6 10*3/uL (ref 0.7–4.0)
MCH: 29.6 pg (ref 26.0–34.0)
MCHC: 33.6 g/dL (ref 30.0–36.0)
MCV: 88.2 fL (ref 78.0–100.0)
Monocytes Absolute: 0.5 10*3/uL (ref 0.1–1.0)
Monocytes Relative: 7 % (ref 3–12)
Neutro Abs: 4.7 10*3/uL (ref 1.7–7.7)
Neutrophils Relative %: 59 % (ref 43–77)
Platelets: 270 10*3/uL (ref 150–400)
RBC: 4.56 MIL/uL (ref 3.87–5.11)
RDW: 12.5 % (ref 11.5–15.5)
WBC: 7.9 10*3/uL (ref 4.0–10.5)

## 2014-12-27 LAB — URINALYSIS, ROUTINE W REFLEX MICROSCOPIC
Bilirubin Urine: NEGATIVE
Glucose, UA: NEGATIVE mg/dL
Ketones, ur: 15 mg/dL — AB
Nitrite: POSITIVE — AB
Protein, ur: NEGATIVE mg/dL
Specific Gravity, Urine: 1.022 (ref 1.005–1.030)
Urobilinogen, UA: 1 mg/dL (ref 0.0–1.0)
pH: 6 (ref 5.0–8.0)

## 2014-12-27 LAB — POC URINE PREG, ED: Preg Test, Ur: NEGATIVE

## 2014-12-27 LAB — BASIC METABOLIC PANEL
Anion gap: 7 (ref 5–15)
BUN: 8 mg/dL (ref 6–23)
CO2: 22 mmol/L (ref 19–32)
Calcium: 8.9 mg/dL (ref 8.4–10.5)
Chloride: 105 mmol/L (ref 96–112)
Creatinine, Ser: 0.98 mg/dL (ref 0.50–1.10)
GFR calc Af Amer: >90 mL/min (ref >90)
GFR calc non Af Amer: 80 mL/min — ABNORMAL LOW (ref >90)
Glucose, Bld: 84 mg/dL (ref 70–99)
Potassium: 3.5 mmol/L (ref 3.5–5.1)
Sodium: 134 mmol/L — ABNORMAL LOW (ref 135–145)

## 2014-12-27 LAB — URINE MICROSCOPIC-ADD ON

## 2014-12-27 LAB — I-STAT TROPONIN, ED: Troponin i, poc: 0 ng/mL (ref 0.00–0.08)

## 2014-12-27 MED ORDER — OXYCODONE-ACETAMINOPHEN 5-325 MG PO TABS: 1.0000 | ORAL_TABLET | Freq: Four times a day (QID) | ORAL | Status: DC | PRN

## 2014-12-27 MED ORDER — DEXTROSE 5 % IV SOLN
1.0000 g | Freq: Once | INTRAVENOUS | Status: AC
  Administered 2014-12-27: 1 g via INTRAVENOUS
  Filled 2014-12-27: qty 10

## 2014-12-27 MED ORDER — DIAZEPAM 5 MG/ML IJ SOLN
5.0000 mg | Freq: Once | INTRAMUSCULAR | Status: AC
  Administered 2014-12-27: 5 mg via INTRAVENOUS
  Filled 2014-12-27: qty 2

## 2014-12-27 MED ORDER — ONDANSETRON HCL 4 MG/2ML IJ SOLN
4.0000 mg | Freq: Once | INTRAMUSCULAR | Status: AC
  Administered 2014-12-27: 4 mg via INTRAVENOUS
  Filled 2014-12-27: qty 2

## 2014-12-27 MED ORDER — FLUCONAZOLE 150 MG PO TABS: 150.0000 mg | ORAL_TABLET | Freq: Once | ORAL | Status: DC

## 2014-12-27 MED ORDER — SODIUM CHLORIDE 0.9 % IV BOLUS (SEPSIS)
1000.0000 mL | Freq: Once | INTRAVENOUS | Status: AC
  Administered 2014-12-27: 1000 mL via INTRAVENOUS

## 2014-12-27 MED ORDER — CIPROFLOXACIN HCL 500 MG PO TABS: 500.0000 mg | ORAL_TABLET | Freq: Two times a day (BID) | ORAL | Status: DC

## 2014-12-27 MED ORDER — FLUCONAZOLE 100 MG PO TABS
150.0000 mg | ORAL_TABLET | Freq: Once | ORAL | Status: AC
  Administered 2014-12-27: 150 mg via ORAL
  Filled 2014-12-27: qty 2

## 2014-12-27 MED ORDER — HYDROMORPHONE HCL 1 MG/ML IJ SOLN
1.0000 mg | Freq: Once | INTRAMUSCULAR | Status: AC
  Administered 2014-12-27: 1 mg via INTRAVENOUS
  Filled 2014-12-27: qty 1

## 2014-12-27 NOTE — ED Notes (Signed)
Pt states she has had Interstitial Cystitis for approximately 2 years. She also had frequent UTI's. Pt began experiencing pain in the bladder region that was unbearable around 0400 this morning. Also reports some pain radiating to the chest area.

## 2014-12-27 NOTE — Discharge Instructions (Signed)
Take cipro twice daily for a week.   Stay hydrated.   Take percocet for severe pain. DO NOT drive with it.   Continue with your valium.   Take diflucan in 3 days if you have yeast infection.   [facility] at [facility].   Return to ER if you have severe pain, fever, vomiting.

## 2014-12-27 NOTE — ED Provider Notes (Signed)
CSN: [id number]     Arrival date & time 12/27/14  0729 History   First MD Initiated Contact with Patient 12/27/14 [phone]     Chief Complaint  Patient presents with  . Abdominal Pain     (Consider location/radiation/quality/duration/timing/severity/associated sxs/prior Treatment) The history is provided by the patient.  Wendy Richards is a 24 y.o. female hx of interstitial cystitis, panic attacks here with dysuria. Last exacerbation of interstitial cystitis was about 6 months ago. This morning she woke up with severe bladder pain and head pain when she urinates. She had some blood in her urine as well which was typical of her interstitial cystitis. Denies any flank pain or history of kidney stones. She denies any vaginal discharge. This morning she also had a panic attack and had some chest pain and shortness of breath associated with it. She states that the chest pain is worse when she moves.    Past Medical History  Diagnosis Date  . Tachycardia   . Panic attacks   . SOB (shortness of breath)   . Hyperthyroidism   . Interstitial cystitis    Past Surgical History  Procedure Laterality Date  . Breast enhancement surgery    . External ear surgery    . Rhinoplasty     Family History  Problem Relation Age of Onset  . Hypertension Maternal Grandfather   . Cancer Maternal Grandfather     died of non hodgkins lymphoma  . Heart attack Paternal Grandfather   . Cancer Maternal Grandmother     uterine cancer   History  Substance Use Topics  . Smoking status: Never Smoker   . Smokeless tobacco: Never Used  . Alcohol Use: Yes     Comment: moderate social drug   OB History    Gravida Para Term Preterm AB TAB SAB Ectopic Multiple Living   0 0        0     Review of Systems  Respiratory: Positive for shortness of breath.   Cardiovascular: Positive for chest pain.  Gastrointestinal: Positive for abdominal pain.  Genitourinary: Positive for dysuria.  All other systems reviewed  and are negative.     Allergies  Review of patient's allergies indicates no known allergies.  Home Medications   Prior to Admission medications   Medication Sig Start Date End Date Taking? Authorizing Provider  aspirin 325 MG tablet Take 650 mg by mouth every 6 (six) hours as needed for moderate pain.   Yes Historical Provider, MD  diazepam (VALIUM) 5 MG tablet Take 5 mg by mouth every 6 (six) hours as needed for anxiety.  07/13/14  Yes Historical Provider, MD  levonorgestrel-ethinyl estradiol (SEASONALE,INTROVALE,JOLESSA) 0.15-0.03 MG tablet Take 1 tablet by mouth every morning.  11/14/12  Yes Historical Provider, MD  traMADol (ULTRAM) 50 MG tablet Take 1-2 tablets (50-100 mg total) by mouth every 6 (six) hours as needed for moderate pain. Patient not taking: Reported on 12/27/2014 07/24/14 07/24/15  Dorathy Kinsman, CNM  triamcinolone cream (KENALOG) 0.1 % Apply 1 application topically 2 (two) times daily as needed. Patient not taking: Reported on 12/27/2014 07/24/14   Dorathy Kinsman, CNM   BP 128/83 mmHg  Temp(Src) 98.5 F (36.9 C) (Oral)  Resp 14  LMP 12/27/2014 Physical Exam  Constitutional: She is oriented to person, place, and time.  Uncomfortable   HENT:  Head: Normocephalic.  Mouth/Throat: Oropharynx is clear and moist.  Eyes: Conjunctivae are normal. Pupils are equal, round, and reactive to light.  Neck: Normal  range of motion. Neck supple.  Cardiovascular: Normal rate, regular rhythm and normal heart sounds.   Pulmonary/Chest: Effort normal and breath sounds normal. No respiratory distress. She has no wheezes. She has no rales.  Reproducible sternal tenderness   Abdominal: Soft. Bowel sounds are normal. She exhibits no distension. There is no tenderness. There is no rebound.  Musculoskeletal: Normal range of motion. She exhibits no edema or tenderness.  Neurological: She is alert and oriented to person, place, and time. No cranial nerve deficit. Coordination normal.  Skin:  Skin is warm and dry.  Psychiatric: She has a normal mood and affect. Her behavior is normal. Judgment and thought content normal.  Nursing note and vitals reviewed.   ED Course  Procedures (including critical care time) Labs Review Labs Reviewed  URINALYSIS, ROUTINE W REFLEX MICROSCOPIC - Abnormal; Notable for the following:    Color, Urine ORANGE (*)    APPearance CLOUDY (*)    Hgb urine dipstick TRACE (*)    Ketones, ur 15 (*)    Nitrite POSITIVE (*)    Leukocytes, UA MODERATE (*)    All other components within normal limits  BASIC METABOLIC PANEL - Abnormal; Notable for the following:    Sodium 134 (*)    GFR calc non Af Amer 80 (*)    All other components within normal limits  URINE MICROSCOPIC-ADD ON - Abnormal; Notable for the following:    Squamous Epithelial / LPF FEW (*)    Bacteria, UA MANY (*)    All other components within normal limits  CBC WITH DIFFERENTIAL/PLATELET  POC URINE PREG, ED  I-STAT TROPOININ, ED    Imaging Review No results found.   EKG Interpretation None      MDM   Final diagnoses:  None   Wendy Richards is a 24 y.o. female here with dysuria, chest pain. Likely IC exaerbation. Chest pain reproducible so I doubt ACS or PE. Will check labs, UA, will give pain meds and reassess.   10:55 AM UA + UTI. WBC nl. Doesn't appear septic. Given ceftriaxone, will dc with cipro. Will give short course of pain meds as well.    Richardean Canalavid H Yao, MD 12/27/14 1055

## 2015-03-21 ENCOUNTER — Emergency Department: Payer: BLUE CROSS/BLUE SHIELD

## 2015-03-21 ENCOUNTER — Encounter: Payer: Self-pay | Admitting: Emergency Medicine

## 2015-03-21 ENCOUNTER — Emergency Department
Admission: EM | Admit: 2015-03-21 | Discharge: 2015-03-21 | Disposition: A | Discharge status: Home / Self Care | Payer: BLUE CROSS/BLUE SHIELD | Attending: Emergency Medicine | Admitting: Emergency Medicine

## 2015-03-21 DIAGNOSIS — Z792 Long term (current) use of antibiotics: Secondary | ICD-10-CM | POA: Insufficient documentation

## 2015-03-21 DIAGNOSIS — R102 Pelvic and perineal pain: Secondary | ICD-10-CM | POA: Diagnosis not present

## 2015-03-21 DIAGNOSIS — Z7952 Long term (current) use of systemic steroids: Secondary | ICD-10-CM | POA: Diagnosis not present

## 2015-03-21 DIAGNOSIS — Z3202 Encounter for pregnancy test, result negative: Secondary | ICD-10-CM | POA: Diagnosis not present

## 2015-03-21 DIAGNOSIS — Z7982 Long term (current) use of aspirin: Secondary | ICD-10-CM | POA: Diagnosis not present

## 2015-03-21 DIAGNOSIS — R1032 Left lower quadrant pain: Secondary | ICD-10-CM | POA: Diagnosis present

## 2015-03-21 LAB — URINALYSIS COMPLETE WITH MICROSCOPIC (ARMC ONLY)
Bacteria, UA: NONE SEEN
Bilirubin Urine: NEGATIVE
Glucose, UA: NEGATIVE mg/dL
Ketones, ur: NEGATIVE mg/dL
Leukocytes, UA: NEGATIVE
Nitrite: NEGATIVE
Protein, ur: NEGATIVE mg/dL
Specific Gravity, Urine: 1.021 (ref 1.005–1.030)
pH: 5 (ref 5.0–8.0)

## 2015-03-21 LAB — COMPREHENSIVE METABOLIC PANEL
ALT: 12 U/L — ABNORMAL LOW (ref 14–54)
AST: 22 U/L (ref 15–41)
Albumin: 4.5 g/dL (ref 3.5–5.0)
Alkaline Phosphatase: 60 U/L (ref 38–126)
Anion gap: 8 (ref 5–15)
BUN: 10 mg/dL (ref 6–20)
CO2: 27 mmol/L (ref 22–32)
Calcium: 9.3 mg/dL (ref 8.9–10.3)
Chloride: 102 mmol/L (ref 101–111)
Creatinine, Ser: 0.73 mg/dL (ref 0.44–1.00)
GFR calc Af Amer: >60 mL/min (ref >60)
GFR calc non Af Amer: >60 mL/min (ref >60)
Glucose, Bld: 95 mg/dL (ref 65–99)
Potassium: 4.1 mmol/L (ref 3.5–5.1)
Sodium: 137 mmol/L (ref 135–145)
Total Bilirubin: 0.9 mg/dL (ref 0.3–1.2)
Total Protein: 7.5 g/dL (ref 6.5–8.1)

## 2015-03-21 LAB — CBC WITH DIFFERENTIAL/PLATELET
Basophils Absolute: 0.1 10*3/uL (ref 0–0.1)
Basophils Relative: 1 %
Eosinophils Absolute: 0 10*3/uL (ref 0–0.7)
Eosinophils Relative: 0 %
HCT: 41 % (ref 35.0–47.0)
Hemoglobin: 13.6 g/dL (ref 12.0–16.0)
Lymphocytes Relative: 33 %
Lymphs Abs: 1.9 10*3/uL (ref 1.0–3.6)
MCH: 30.2 pg (ref 26.0–34.0)
MCHC: 33.3 g/dL (ref 32.0–36.0)
MCV: 90.6 fL (ref 80.0–100.0)
Monocytes Absolute: 0.4 10*3/uL (ref 0.2–0.9)
Monocytes Relative: 7 %
Neutro Abs: 3.5 10*3/uL (ref 1.4–6.5)
Neutrophils Relative %: 59 %
Platelets: 366 10*3/uL (ref 150–440)
RBC: 4.52 MIL/uL (ref 3.80–5.20)
RDW: 12.8 % (ref 11.5–14.5)
WBC: 5.9 10*3/uL (ref 3.6–11.0)

## 2015-03-21 LAB — POCT PREGNANCY, URINE: Preg Test, Ur: NEGATIVE

## 2015-03-21 MED ORDER — TRAMADOL HCL 50 MG PO TABS
ORAL_TABLET | ORAL | Status: AC
  Administered 2015-03-21: 100 mg via ORAL
  Filled 2015-03-21: qty 2

## 2015-03-21 MED ORDER — TRAMADOL HCL 50 MG PO TABS
100.0000 mg | ORAL_TABLET | Freq: Once | ORAL | Status: AC
  Administered 2015-03-21: 100 mg via ORAL

## 2015-03-21 NOTE — Discharge Instructions (Signed)
Abdominal Pain, Women °Abdominal (stomach, pelvic, or belly) pain can be caused by many things. It is important to tell your doctor: °· The location of the pain. °· Does it come and go or is it present all the time? °· Are there things that start the pain (eating certain foods, exercise)? °· Are there other symptoms associated with the pain (fever, nausea, vomiting, diarrhea)? °All of this is helpful to know when trying to find the cause of the pain. °CAUSES  °· Stomach: virus or bacteria infection, or ulcer. °· Intestine: appendicitis (inflamed appendix), regional ileitis (Crohn's disease), ulcerative colitis (inflamed colon), irritable bowel syndrome, diverticulitis (inflamed diverticulum of the colon), or cancer of the stomach or intestine. °· Gallbladder disease or stones in the gallbladder. °· Kidney disease, kidney stones, or infection. °· Pancreas infection or cancer. °· Fibromyalgia (pain disorder). °· Diseases of the female organs: °¨ Uterus: fibroid (non-cancerous) tumors or infection. °¨ Fallopian tubes: infection or tubal pregnancy. °¨ Ovary: cysts or tumors. °¨ Pelvic adhesions (scar tissue). °¨ Endometriosis (uterus lining tissue growing in the pelvis and on the pelvic organs). °¨ Pelvic congestion syndrome (female organs filling up with blood just before the menstrual period). °¨ Pain with the menstrual period. °¨ Pain with ovulation (producing an egg). °¨ Pain with an IUD (intrauterine device, birth control) in the uterus. °¨ Cancer of the female organs. °· Functional pain (pain not caused by a disease, may improve without treatment). °· Psychological pain. °· Depression. °DIAGNOSIS  °Your doctor will decide the seriousness of your pain by doing an examination. °· Blood tests. °· X-rays. °· Ultrasound. °· CT scan (computed tomography, special type of X-ray). °· MRI (magnetic resonance imaging). °· Cultures, for infection. °· Barium enema (dye inserted in the large intestine, to better view it with  X-rays). °· Colonoscopy (looking in intestine with a lighted tube). °· Laparoscopy (minor surgery, looking in abdomen with a lighted tube). °· Major abdominal exploratory surgery (looking in abdomen with a large incision). °TREATMENT  °The treatment will depend on the cause of the pain.  °· Many cases can be observed and treated at home. °· Over-the-counter medicines recommended by your caregiver. °· Prescription medicine. °· Antibiotics, for infection. °· Birth control pills, for painful periods or for ovulation pain. °· Hormone treatment, for endometriosis. °· Nerve blocking injections. °· Physical therapy. °· Antidepressants. °· Counseling with a psychologist or psychiatrist. °· Minor or major surgery. °HOME CARE INSTRUCTIONS  °· Do not take laxatives, unless directed by your caregiver. °· Take over-the-counter pain medicine only if ordered by your caregiver. Do not take aspirin because it can cause an upset stomach or bleeding. °· Try a clear liquid diet (broth or water) as ordered by your caregiver. Slowly move to a bland diet, as tolerated, if the pain is related to the stomach or intestine. °· Have a thermometer and take your temperature several times a day, and record it. °· Bed rest and sleep, if it helps the pain. °· Avoid sexual intercourse, if it causes pain. °· Avoid stressful situations. °· Keep your follow-up appointments and tests, as your caregiver orders. °· If the pain does not go away with medicine or surgery, you may try: °¨ Acupuncture. °¨ Relaxation exercises (yoga, meditation). °¨ Group therapy. °¨ Counseling. °SEEK MEDICAL CARE IF:  °· You notice certain foods cause stomach pain. °· Your home care treatment is not helping your pain. °· You need stronger pain medicine. °· You want your IUD removed. °· You feel faint or   lightheaded. °· You develop nausea and vomiting. °· You develop a rash. °· You are having side effects or an allergy to your medicine. °SEEK IMMEDIATE MEDICAL CARE IF:  °· Your  pain does not go away or gets worse. °· You have a fever. °· Your pain is felt only in portions of the abdomen. The right side could possibly be appendicitis. The left lower portion of the abdomen could be colitis or diverticulitis. °· You are passing blood in your stools (bright red or black tarry stools, with or without vomiting). °· You have blood in your urine. °· You develop chills, with or without a fever. °· You pass out. °MAKE SURE YOU:  °· Understand these instructions. °· Will watch your condition. °· Will get help right away if you are not doing well or get worse. °Document Released: 08/19/2007 Document Revised: 03/08/2014 Document Reviewed: 09/08/2009 °[employer]® Patient Information ©2015 [employer]. This information is not intended to replace advice given to you by your health care provider. Make sure you discuss any questions you have with your health care provider. ° °

## 2015-03-21 NOTE — ED Notes (Signed)
Patient presents to the ED with LLQ pain that began at 7am this morning.  Patient reports one episode of vomiting yesterday but denies any vomiting today.  Patient states occasional vomiting is normal for her because she has "IC".  Patient states she sees Dr. Logan BoresEvans and she takes several new medications that seem to make her nauseous.  Patient takes macrobid daily.  Patient denies diarrhea today.  Area of pain is tender on palpation.

## 2015-03-21 NOTE — ED Provider Notes (Signed)
[facility] Emergency Department Provider Note  Time seen: 3:55 PM  I have reviewed the triage vital signs and the nursing notes.   HISTORY  Chief Complaint Abdominal Pain    HPI Wendy Richards is a 24 y.o. female with a past medical history of anxiety, hyperthyroidism, interstitial cystitis who presents the emergency department with left lower quadrant pain. According to the patient the pain started this morning, is sharp in nature, moderate in severity. Patient does have some associated nausea, denies vomiting, denies diarrhea, denies dysuria, denies vaginal discharge. Patient states she does have intermittent vaginal bleeding which is typical for her. Patient denies any history of ovarian cysts, but her mother has had 7 cyst in the past per the mom.Patient denies fever at home.     Past Medical History  Diagnosis Date  . Tachycardia   . Panic attacks   . SOB (shortness of breath)   . Hyperthyroidism   . Interstitial cystitis     Patient Active Problem List   Diagnosis Date Noted  . Tachycardia 10/15/2012  . Palpitations 10/15/2012  . Panic anxiety syndrome 10/15/2012    Past Surgical History  Procedure Laterality Date  . Breast enhancement surgery    . External ear surgery    . Rhinoplasty      Current Outpatient Rx  Name  Route  Sig  Dispense  Refill  . aspirin 325 MG tablet   Oral   Take 650 mg by mouth every 6 (six) hours as needed for moderate pain.         . ciprofloxacin (CIPRO) 500 MG tablet   Oral   Take 1 tablet (500 mg total) by mouth 2 (two) times daily. One po bid x 7 days   14 tablet   0   . diazepam (VALIUM) 5 MG tablet   Oral   Take 5 mg by mouth every 6 (six) hours as needed for anxiety.          . fluconazole (DIFLUCAN) 150 MG tablet   Oral   Take 1 tablet (150 mg total) by mouth once.   1 tablet   0   . levonorgestrel-ethinyl estradiol (SEASONALE,INTROVALE,JOLESSA) 0.15-0.03 MG tablet   Oral   Take  1 tablet by mouth every morning.          Marland Kitchen. oxyCODONE-acetaminophen (PERCOCET) 5-325 MG per tablet   Oral   Take 1-2 tablets by mouth every 6 (six) hours as needed.   10 tablet   0   . traMADol (ULTRAM) 50 MG tablet   Oral   Take 1-2 tablets (50-100 mg total) by mouth every 6 (six) hours as needed for moderate pain. Patient not taking: Reported on 12/27/2014   20 tablet   0   . triamcinolone cream (KENALOG) 0.1 %   Topical   Apply 1 application topically 2 (two) times daily as needed. Patient not taking: Reported on 12/27/2014   30 g   0     Allergies Review of patient's allergies indicates no known allergies.  Family History  Problem Relation Age of Onset  . Hypertension Maternal Grandfather   . Cancer Maternal Grandfather     died of non hodgkins lymphoma  . Heart attack Paternal Grandfather   . Cancer Maternal Grandmother     uterine cancer  . Ovarian cysts Mother     Social History History  Substance Use Topics  . Smoking status: Never Smoker   . Smokeless tobacco: Never Used  .  Alcohol Use: Yes     Comment: moderate social drug    Review of Systems Constitutional: Negative for fever. Cardiovascular: Negative for chest pain. Respiratory: Negative for shortness of breath. Gastrointestinal: Positive for left lower abdominal pain. Genitourinary: Negative for dysuria. Musculoskeletal: Negative for back pain.  10-point ROS otherwise negative.  ____________________________________________   PHYSICAL EXAM:  VITAL SIGNS: ED Triage Vitals  Enc Vitals Group     BP 03/21/15 1328 118/33 mmHg     Pulse Rate 03/21/15 1328 60     Resp 03/21/15 1328 16     Temp 03/21/15 1328 98.3 F (36.8 C)     Temp Source 03/21/15 1328 Oral     SpO2 03/21/15 1328 98 %     Weight 03/21/15 1328 128 lb (58.06 kg)     Height 03/21/15 1328 5\' 6"  (1.676 m)     Head Cir --      Peak Flow --      Pain Score 03/21/15 1329 7     Pain Loc --      Pain Edu? --      Excl. in  GC? --     Constitutional: Alert and oriented. Well appearing and in no distress. ENT   Mouth/Throat: Mucous membranes are moist. Cardiovascular: Normal rate, regular rhythm. No murmurs, rubs, or gallops. Respiratory: Normal respiratory effort without tachypnea nor retractions. Breath sounds are clear and equal bilaterally. No wheezes/rales/rhonchi. Gastrointestinal: Mild left lower quadrant tenderness palpation. No CVA tenderness palpation. No rebound, no guarding. Musculoskeletal: Nontender with normal range of motion in all extremities.  Neurologic:  Normal speech and language Skin:  Skin is warm, dry and intact.  Psychiatric: Mood and affect are normal.       RADIOLOGY  Ultrasound within normal limits.  ____________________________________________   INITIAL IMPRESSION / ASSESSMENT AND PLAN / ED COURSE  Pertinent labs & imaging results that were available during my care of the patient were reviewed by me and considered in my medical decision making (see chart for details).  24 year old female with 1 day of left lower quadrant pain. Minimal tenderness on exam. Labs are within normal limits. We will obtain a pelvic ultrasound to further evaluate. Patient agreeable to plan.  ----------------------------------------- 6:20 PM on 03/21/2015 -----------------------------------------  Pelvic ultrasound within normal limits. Labs within normal limits. His cussed with patient she will follow-up with her GYN.  ____________________________________________   FINAL CLINICAL IMPRESSION(S) / ED DIAGNOSES  Pelvic pain   Minna AntisKevin Hilton Saephan, MD 03/21/15 1820

## 2015-03-28 ENCOUNTER — Emergency Department
Admission: EM | Admit: 2015-03-28 | Discharge: 2015-03-28 | Disposition: A | Discharge status: Home / Self Care | Payer: BLUE CROSS/BLUE SHIELD | Attending: Emergency Medicine | Admitting: Emergency Medicine

## 2015-03-28 ENCOUNTER — Other Ambulatory Visit: Payer: Self-pay

## 2015-03-28 ENCOUNTER — Encounter: Payer: Self-pay | Admitting: Emergency Medicine

## 2015-03-28 DIAGNOSIS — R300 Dysuria: Secondary | ICD-10-CM | POA: Diagnosis not present

## 2015-03-28 DIAGNOSIS — Z7952 Long term (current) use of systemic steroids: Secondary | ICD-10-CM | POA: Insufficient documentation

## 2015-03-28 DIAGNOSIS — Z79899 Other long term (current) drug therapy: Secondary | ICD-10-CM | POA: Diagnosis not present

## 2015-03-28 DIAGNOSIS — Z792 Long term (current) use of antibiotics: Secondary | ICD-10-CM | POA: Insufficient documentation

## 2015-03-28 DIAGNOSIS — F410 Panic disorder [episodic paroxysmal anxiety] without agoraphobia: Secondary | ICD-10-CM | POA: Diagnosis not present

## 2015-03-28 DIAGNOSIS — Z7982 Long term (current) use of aspirin: Secondary | ICD-10-CM | POA: Diagnosis not present

## 2015-03-28 DIAGNOSIS — Z793 Long term (current) use of hormonal contraceptives: Secondary | ICD-10-CM | POA: Insufficient documentation

## 2015-03-28 DIAGNOSIS — Z3202 Encounter for pregnancy test, result negative: Secondary | ICD-10-CM | POA: Diagnosis not present

## 2015-03-28 DIAGNOSIS — R531 Weakness: Secondary | ICD-10-CM | POA: Diagnosis present

## 2015-03-28 LAB — CBC
HCT: 41.4 % (ref 35.0–47.0)
Hemoglobin: 14 g/dL (ref 12.0–16.0)
MCH: 30.3 pg (ref 26.0–34.0)
MCHC: 33.8 g/dL (ref 32.0–36.0)
MCV: 89.5 fL (ref 80.0–100.0)
Platelets: 337 10*3/uL (ref 150–440)
RBC: 4.63 MIL/uL (ref 3.80–5.20)
RDW: 13.1 % (ref 11.5–14.5)
WBC: 10.5 10*3/uL (ref 3.6–11.0)

## 2015-03-28 LAB — COMPREHENSIVE METABOLIC PANEL
ALT: 11 U/L — ABNORMAL LOW (ref 14–54)
AST: 21 U/L (ref 15–41)
Albumin: 4.3 g/dL (ref 3.5–5.0)
Alkaline Phosphatase: 51 U/L (ref 38–126)
Anion gap: 9 (ref 5–15)
BUN: 11 mg/dL (ref 6–20)
CO2: 24 mmol/L (ref 22–32)
Calcium: 9.3 mg/dL (ref 8.9–10.3)
Chloride: 102 mmol/L (ref 101–111)
Creatinine, Ser: 0.92 mg/dL (ref 0.44–1.00)
GFR calc Af Amer: >60 mL/min (ref >60)
GFR calc non Af Amer: >60 mL/min (ref >60)
Glucose, Bld: 99 mg/dL (ref 65–99)
Potassium: 3.3 mmol/L — ABNORMAL LOW (ref 3.5–5.1)
Sodium: 135 mmol/L (ref 135–145)
Total Bilirubin: 0.8 mg/dL (ref 0.3–1.2)
Total Protein: 8 g/dL (ref 6.5–8.1)

## 2015-03-28 LAB — URINALYSIS COMPLETE WITH MICROSCOPIC (ARMC ONLY)
Bilirubin Urine: NEGATIVE
Glucose, UA: NEGATIVE mg/dL
Hgb urine dipstick: NEGATIVE
Leukocytes, UA: NEGATIVE
Nitrite: NEGATIVE
Protein, ur: NEGATIVE mg/dL
Specific Gravity, Urine: 1.024 (ref 1.005–1.030)
pH: 6 (ref 5.0–8.0)

## 2015-03-28 LAB — POCT PREGNANCY, URINE: Preg Test, Ur: NEGATIVE

## 2015-03-28 LAB — TROPONIN I: Troponin I: <0.03 ng/mL (ref <0.031)

## 2015-03-28 MED ORDER — TRAMADOL HCL 50 MG PO TABS
50.0000 mg | ORAL_TABLET | Freq: Once | ORAL | Status: AC
  Administered 2015-03-28: 50 mg via ORAL

## 2015-03-28 MED ORDER — TRAMADOL HCL 50 MG PO TABS
ORAL_TABLET | ORAL | Status: AC
  Administered 2015-03-28: 50 mg via ORAL
  Filled 2015-03-28: qty 1

## 2015-03-28 NOTE — ED Provider Notes (Signed)
[facility] Emergency Department Provider Note  ____________________________________________  Time seen: 10:30 AM  I have reviewed the triage vital signs and the nursing notes.   HISTORY  Chief Complaint Weakness      HPI Wendy Richards is a 24 y.o. female who presents after likely anxiety attack. Patient with a history of anxiety and panic attacks reports that she was driving to the gas station today and when she got there she felt like the walls were closing and then she could not breathe. She complained of cramping in both hands bilaterally and felt weak all over. On arrival she is tearful and anxious although reports feeling better than she did. She has no chest pain now. She has no focal deficits. She denies headache to me. no nausea no vomiting     Past Medical History  Diagnosis Date  . Tachycardia   . Panic attacks   . SOB (shortness of breath)   . Hyperthyroidism   . Interstitial cystitis   . Interstitial cystitis     Patient Active Problem List   Diagnosis Date Noted  . Tachycardia 10/15/2012  . Palpitations 10/15/2012  . Panic anxiety syndrome 10/15/2012    Past Surgical History  Procedure Laterality Date  . Breast enhancement surgery    . External ear surgery    . Rhinoplasty      Current Outpatient Rx  Name  Route  Sig  Dispense  Refill  . aspirin 325 MG tablet   Oral   Take 650 mg by mouth every 6 (six) hours as needed for moderate pain.         . ciprofloxacin (CIPRO) 500 MG tablet   Oral   Take 1 tablet (500 mg total) by mouth 2 (two) times daily. One po bid x 7 days   14 tablet   0   . diazepam (VALIUM) 5 MG tablet   Oral   Take 5 mg by mouth every 6 (six) hours as needed for anxiety.          . fluconazole (DIFLUCAN) 150 MG tablet   Oral   Take 1 tablet (150 mg total) by mouth once.   1 tablet   0   . levonorgestrel-ethinyl estradiol (SEASONALE,INTROVALE,JOLESSA) 0.15-0.03 MG tablet   Oral   Take  1 tablet by mouth every morning.          Marland Kitchen oxyCODONE-acetaminophen (PERCOCET) 5-325 MG per tablet   Oral   Take 1-2 tablets by mouth every 6 (six) hours as needed.   10 tablet   0   . traMADol (ULTRAM) 50 MG tablet   Oral   Take 1-2 tablets (50-100 mg total) by mouth every 6 (six) hours as needed for moderate pain. Patient not taking: Reported on 12/27/2014   20 tablet   0   . triamcinolone cream (KENALOG) 0.1 %   Topical   Apply 1 application topically 2 (two) times daily as needed. Patient not taking: Reported on 12/27/2014   30 g   0     Allergies Review of patient's allergies indicates no known allergies.  Family History  Problem Relation Age of Onset  . Hypertension Maternal Grandfather   . Cancer Maternal Grandfather     died of non hodgkins lymphoma  . Heart attack Paternal Grandfather   . Cancer Maternal Grandmother     uterine cancer  . Ovarian cysts Mother     Social History History  Substance Use Topics  . Smoking status:  Never Smoker   . Smokeless tobacco: Never Used  . Alcohol Use: Yes     Comment: moderate social drug    Review of Systems  Constitutional: Negative for fever. Eyes: Negative for visual changes. ENT: Negative for sore throat. Cardiovascular: Negative for chest pain. Respiratory: Negative for shortness of breath. Gastrointestinal: Negative for abdominal pain, vomiting and diarrhea. Genitourinary: Chronic dysuria Musculoskeletal: Negative for back pain. Skin: Negative for rash. Neurological: Negative for headaches, focal weakness or numbness. Psychiatric: Positive for anxiety   10-point ROS otherwise negative.  ____________________________________________   PHYSICAL EXAM:  VITAL SIGNS: ED Triage Vitals  Enc Vitals Group     BP 03/28/15 0736 126/79 mmHg     Pulse Rate 03/28/15 0736 84     Resp 03/28/15 0736 20     Temp 03/28/15 0736 98.5 F (36.9 C)     Temp Source 03/28/15 0736 Oral     SpO2 03/28/15 0736 100 %      Weight 03/28/15 0736 132 lb (59.875 kg)     Height 03/28/15 0736  (1.676 m)     Head Cir --      Peak Flow --      Pain Score 03/28/15 0737 4     Pain Loc --      Pain Edu? --      Excl. in GC? --      Constitutional: Alert and oriented. Well appearing and in no distress. Eyes: Conjunctivae are normal. PERRL. Normal extraocular movements. ENT   Head: Normocephalic and atraumatic.   Nose: No congestion/rhinnorhea.   Mouth/Throat: Mucous membranes are moist.   Neck: No stridor. Hematological/Lymphatic/Immunilogical: No cervical lymphadenopathy. Cardiovascular: Normal rate, regular rhythm. Normal and symmetric distal pulses are present in all extremities. No murmurs, rubs, or gallops. Respiratory: Normal respiratory effort without tachypnea nor retractions. Breath sounds are clear and equal bilaterally. No wheezes/rales/rhonchi. Gastrointestinal: Soft and nontender. No distention. There is no CVA tenderness. Genitourinary: deferred Musculoskeletal: Nontender with normal range of motion in all extremities. No joint effusions.  No lower extremity tenderness nor edema. Neurologic:  Normal speech and language. No gross focal neurologic deficits are appreciated. Speech is normal.  Skin:  Skin is warm, dry and intact. No rash noted. Psychiatric: Mood and affect are normal. Speech and behavior are normal. Patient exhibits appropriate insight and judgment.  ____________________________________________    LABS (pertinent positives/negatives)  Labs Reviewed  COMPREHENSIVE METABOLIC PANEL - Abnormal; Notable for the following:    Potassium 3.3 (*)    ALT 11 (*)    All other components within normal limits  URINALYSIS COMPLETEWITH MICROSCOPIC ([facility])  - Abnormal; Notable for the following:    Color, Urine YELLOW (*)    APPearance CLEAR (*)    Ketones, ur 1+ (*)    Bacteria, UA RARE (*)    Squamous Epithelial / LPF 0-5 (*)    All other components within normal limits   CBC  TROPONIN I  POC URINE PREG, ED  POCT PREGNANCY, URINE     ____________________________________________   EKG Interpreted by me  Date: 03/28/2015  Rate: 76  Rhythm: normal sinus rhythm  QRS Axis: normal  Intervals: normal  ST/T Wave abnormalities: normal  Conduction Disutrbances: none  Narrative Interpretation: unremarkable      ____________________________________________    RADIOLOGY  None  ____________________________________________   PROCEDURES  Procedure(s) performed: None  Critical Care performed: None  ____________________________________________   INITIAL IMPRESSION / ASSESSMENT AND PLAN / ED COURSE  Pertinent labs &  imaging results that were available during my care of the patient were reviewed by me and considered in my medical decision making (see chart for details).  Patient well-appearing and has a normal exam. Calm with normal heart rate and normal blood pressure in the ED. History of present illness is very consistent with a panic attack. Given her history of same I suspect that is what happened. Family has requested a CT scan of the head but I have explained that do not see any medical reason to do so and they understand. Patient will follow-up with her PCP  ____________________________________________   FINAL CLINICAL IMPRESSION(S) / ED DIAGNOSES  Final diagnoses:  Anxiety attack     Jene Everyobert Cassian Torelli, MD 03/28/15 [phone]

## 2015-03-28 NOTE — Discharge Instructions (Signed)
Panic Attacks °Panic attacks are sudden, short feelings of great fear or discomfort. You may have them for no reason when you are relaxed, when you are uneasy (anxious), or when you are sleeping.  °HOME CARE °· Take all your medicines as told. °· Check with your doctor before starting new medicines. °· Keep all doctor visits. °GET HELP IF: °· You are not able to take your medicines as told. °· Your symptoms do not get better. °· Your symptoms get worse. °GET HELP RIGHT AWAY IF: °· Your attacks seem different than your normal attacks. °· You have thoughts about hurting yourself or others. °· You take panic attack medicine and you have a side effect. °MAKE SURE YOU: °· Understand these instructions. °· Will watch your condition. °· Will get help right away if you are not doing well or get worse. °Document Released: 11/24/2010 Document Revised: 08/12/2013 Document Reviewed: 06/05/2013 °[employer]® Patient Information ©2015 [employer]. This information is not intended to replace advice given to you by your health care provider. Make sure you discuss any questions you have with your health care provider. ° °

## 2015-03-28 NOTE — ED Notes (Signed)
States was driving to work and felt "weird", [place] [employer] [place] there, started feeling numb and hands cramping, arrives tearful, slight hyperventilating, nauseated

## 2015-12-16 ENCOUNTER — Emergency Department: Payer: 59

## 2015-12-16 ENCOUNTER — Emergency Department
Admission: EM | Admit: 2015-12-16 | Discharge: 2015-12-16 | Disposition: A | Discharge status: Home / Self Care | Payer: 59 | Attending: Emergency Medicine | Admitting: Emergency Medicine

## 2015-12-16 ENCOUNTER — Encounter: Payer: Self-pay | Admitting: Emergency Medicine

## 2015-12-16 DIAGNOSIS — G43109 Migraine with aura, not intractable, without status migrainosus: Secondary | ICD-10-CM | POA: Diagnosis not present

## 2015-12-16 DIAGNOSIS — Z793 Long term (current) use of hormonal contraceptives: Secondary | ICD-10-CM | POA: Diagnosis not present

## 2015-12-16 DIAGNOSIS — Z7982 Long term (current) use of aspirin: Secondary | ICD-10-CM | POA: Diagnosis not present

## 2015-12-16 DIAGNOSIS — G43909 Migraine, unspecified, not intractable, without status migrainosus: Secondary | ICD-10-CM

## 2015-12-16 DIAGNOSIS — F419 Anxiety disorder, unspecified: Secondary | ICD-10-CM | POA: Diagnosis not present

## 2015-12-16 DIAGNOSIS — R51 Headache: Secondary | ICD-10-CM | POA: Diagnosis present

## 2015-12-16 LAB — GLUCOSE, CAPILLARY
Glucose-Capillary: 65 mg/dL (ref 65–99)
Glucose-Capillary: 57 mg/dL — ABNORMAL LOW (ref 65–99)
Glucose-Capillary: 77 mg/dL (ref 65–99)

## 2015-12-16 LAB — PROTIME-INR
INR: 1.15
Prothrombin Time: 14.9 seconds (ref 11.4–15.0)

## 2015-12-16 LAB — COMPREHENSIVE METABOLIC PANEL
ALT: 19 U/L (ref 14–54)
AST: 62 U/L — ABNORMAL HIGH (ref 15–41)
Albumin: 4.8 g/dL (ref 3.5–5.0)
Alkaline Phosphatase: 61 U/L (ref 38–126)
Anion gap: 10 (ref 5–15)
BUN: 9 mg/dL (ref 6–20)
CO2: 22 mmol/L (ref 22–32)
Calcium: 9.5 mg/dL (ref 8.9–10.3)
Chloride: 103 mmol/L (ref 101–111)
Creatinine, Ser: 1.04 mg/dL — ABNORMAL HIGH (ref 0.44–1.00)
GFR calc Af Amer: >60 mL/min (ref >60)
GFR calc non Af Amer: >60 mL/min (ref >60)
Glucose, Bld: 81 mg/dL (ref 65–99)
Potassium: 3.7 mmol/L (ref 3.5–5.1)
Sodium: 135 mmol/L (ref 135–145)
Total Bilirubin: 1.3 mg/dL — ABNORMAL HIGH (ref 0.3–1.2)
Total Protein: 8 g/dL (ref 6.5–8.1)

## 2015-12-16 LAB — APTT: aPTT: 28 seconds (ref 24–36)

## 2015-12-16 LAB — DIFFERENTIAL
Basophils Absolute: 0.1 10*3/uL (ref 0–0.1)
Basophils Relative: 1 %
Eosinophils Absolute: 0 10*3/uL (ref 0–0.7)
Eosinophils Relative: 0 %
Lymphocytes Relative: 28 %
Lymphs Abs: 2.3 10*3/uL (ref 1.0–3.6)
Monocytes Absolute: 0.6 10*3/uL (ref 0.2–0.9)
Monocytes Relative: 7 %
Neutro Abs: 5.3 10*3/uL (ref 1.4–6.5)
Neutrophils Relative %: 64 %

## 2015-12-16 LAB — CBC
HCT: 42.7 % (ref 35.0–47.0)
Hemoglobin: 14.4 g/dL (ref 12.0–16.0)
MCH: 30.1 pg (ref 26.0–34.0)
MCHC: 33.8 g/dL (ref 32.0–36.0)
MCV: 88.9 fL (ref 80.0–100.0)
Platelets: 326 10*3/uL (ref 150–440)
RBC: 4.8 MIL/uL (ref 3.80–5.20)
RDW: 12.6 % (ref 11.5–14.5)
WBC: 8.2 10*3/uL (ref 3.6–11.0)

## 2015-12-16 LAB — CKMB (ARMC ONLY): CK, MB: 4.4 ng/mL (ref 0.5–5.0)

## 2015-12-16 LAB — TSH: TSH: 0.632 u[IU]/mL (ref 0.350–4.500)

## 2015-12-16 MED ORDER — ASPIRIN 81 MG PO CHEW
81.0000 mg | CHEWABLE_TABLET | Freq: Once | ORAL | Status: AC
  Administered 2015-12-16: 81 mg via ORAL
  Filled 2015-12-16: qty 1

## 2015-12-16 MED ORDER — DIPHENHYDRAMINE HCL 50 MG/ML IJ SOLN
25.0000 mg | Freq: Once | INTRAMUSCULAR | Status: AC
  Administered 2015-12-16: 25 mg via INTRAVENOUS
  Filled 2015-12-16: qty 1

## 2015-12-16 MED ORDER — METOCLOPRAMIDE HCL 5 MG/ML IJ SOLN
10.0000 mg | Freq: Once | INTRAMUSCULAR | Status: AC
  Administered 2015-12-16: 10 mg via INTRAVENOUS
  Filled 2015-12-16: qty 2

## 2015-12-16 MED ORDER — IOHEXOL 350 MG/ML SOLN
75.0000 mL | Freq: Once | INTRAVENOUS | Status: AC | PRN
  Administered 2015-12-16: 75 mL via INTRAVENOUS

## 2015-12-16 MED ORDER — LORAZEPAM 2 MG/ML IJ SOLN
0.5000 mg | Freq: Once | INTRAMUSCULAR | Status: DC
Start: 2015-12-16 — End: 2015-12-16

## 2015-12-16 NOTE — Consult Note (Signed)
Referring Physician: Fanny Bien    Chief Complaint: Headahce, right facial droop  HPI: Wendy Richards is an 25 y.o. female who was at work today mad ad the acute onset of headache and right sided facial droop/dysarthria.  Patient reports that her headache is occipital on the right and radiating upward.  She has no associated nausea or vomiting.  She has had headaches for the past few months which is unusual for her.  They are usually relieved with OTC medications.  She rates her headache today at a 10/10.  With onset of the headache patient also noted that her right eye was twitching.  Was felt to have right facial droop.  Speech was impaired.  Patient's mother was called at that time an the patient was brought in for evaluation.  Initial NIHSS of 3.    Date last known well: 12/16/2015 Time last known well: Time: 10:00 tPA Given: No: Improving symptoms  Past Medical History  Diagnosis Date  . Tachycardia   . Panic attacks   . SOB (shortness of breath)   . Hyperthyroidism   . Interstitial cystitis   . Interstitial cystitis     Past Surgical History  Procedure Laterality Date  . Breast enhancement surgery    . External ear surgery    . Rhinoplasty      Family History  Problem Relation Age of Onset  . Hypertension Maternal Grandfather   . Cancer Maternal Grandfather     died of non hodgkins lymphoma  . Heart attack Paternal Grandfather   . Cancer Maternal Grandmother     uterine cancer  . Ovarian cysts Mother   Maternal aunt with cerebral aneurysm  Social History:  reports that she has never smoked. She has never used smokeless tobacco. She reports that she drinks alcohol. She reports that she does not use illicit drugs.  Allergies: No Known Allergies  Medications: I have reviewed the patient's current medications. Prior to Admission:  Prior to Admission medications   Medication Sig Start Date End Date Taking? Authorizing Provider  aspirin 325 MG tablet Take 650 mg by mouth  every 6 (six) hours as needed for moderate pain.    Historical Provider, MD  ciprofloxacin (CIPRO) 500 MG tablet Take 1 tablet (500 mg total) by mouth 2 (two) times daily. One po bid x 7 days 12/27/14   Richardean Canal, MD  diazepam (VALIUM) 5 MG tablet Take 5 mg by mouth every 6 (six) hours as needed for anxiety.  07/13/14   Historical Provider, MD  fluconazole (DIFLUCAN) 150 MG tablet Take 1 tablet (150 mg total) by mouth once. 12/27/14   Richardean Canal, MD  levonorgestrel-ethinyl estradiol (SEASONALE,INTROVALE,JOLESSA) 0.15-0.03 MG tablet Take 1 tablet by mouth every morning.  11/14/12   Historical Provider, MD  oxyCODONE-acetaminophen (PERCOCET) 5-325 MG per tablet Take 1-2 tablets by mouth every 6 (six) hours as needed. 12/27/14   Richardean Canal, MD  triamcinolone cream (KENALOG) 0.1 % Apply 1 application topically 2 (two) times daily as needed. Patient not taking: Reported on 12/27/2014 07/24/14   Dorathy Kinsman, CNM    ROS: History obtained from the patient  General ROS: negative for - chills, fatigue, fever, night sweats, weight gain or weight loss Psychological ROS: negative for - behavioral disorder, hallucinations, memory difficulties, mood swings or suicidal ideation Ophthalmic ROS: negative for - blurry vision, double vision, eye pain or loss of vision ENT ROS: negative for - epistaxis, nasal discharge, oral lesions, sore throat, tinnitus or vertigo  Allergy and Immunology ROS: negative for - hives or itchy/watery eyes Hematological and Lymphatic ROS: negative for - bleeding problems, bruising or swollen lymph nodes Endocrine ROS: negative for - galactorrhea, hair pattern changes, polydipsia/polyuria or temperature intolerance Respiratory ROS: negative for - cough, hemoptysis, shortness of breath or wheezing Cardiovascular ROS: negative for - chest pain, dyspnea on exertion, edema or irregular heartbeat Gastrointestinal ROS: negative for - abdominal pain, diarrhea, hematemesis, nausea/vomiting or  stool incontinence Genito-Urinary ROS: negative for - dysuria, hematuria, incontinence or urinary frequency/urgency Musculoskeletal ROS: muscle burning Neurological ROS: as noted in HPI Dermatological ROS: negative for rash and skin lesion changes  Physical Examination: Blood pressure 133/93, pulse 108, temperature 98.6 F (37 C), temperature source Oral, resp. rate 18, height  (1.676 m), weight 63.05 kg (139 lb), SpO2 100 %.  HEENT-  Normocephalic, no lesions, without obvious abnormality.  Normal external eye and conjunctiva.  Normal TM's bilaterally.  Normal auditory canals and external ears. Normal external nose, mucus membranes and septum.  Normal pharynx. Cardiovascular- S1, S2 normal, pulses palpable throughout   Lungs- chest clear, no wheezing, rales, normal symmetric air entry Abdomen- soft, non-tender; bowel sounds normal; no masses,  no organomegaly Extremities- no edema Lymph-no adenopathy palpable Musculoskeletal-no joint tenderness, deformity or swelling Skin-warm and dry, no hyperpigmentation, vitiligo, or suspicious lesions  Neurological Examination Mental Status: Alert, oriented, thought content appropriate.  Speech fluent without evidence of aphasia.  Some slurring of speech noted.  Able to follow 3 step commands without difficulty. Cranial Nerves: II: Discs flat bilaterally; Visual fields grossly normal, pupils equal, round, reactive to light and accommodation III,IV, VI: right ptosis that is somewhat spasmodic, extra-ocular motions intact bilaterally V,VII: mild decrease in the right NLF, facial light touch sensation decreased on the right VIII: hearing normal bilaterally IX,X: gag reflex present XI: bilateral shoulder shrug XII: midline tongue extension Motor: Generalized weakness which patient attributes to muscle burning.  No focal weakness noted.   Sensory: Pinprick and light touch intact in the extremities, bilaterally Deep Tendon Reflexes: 2+ and  symmetric throughout Plantars: Right: mute   Left: mute Cerebellar: Normal finger-to-nose and normal heel-to-shin testing bilaterally Gait: not tested due to safety concerns   Laboratory Studies:  Basic Metabolic Panel: No results for input(s): NA, K, CL, CO2, GLUCOSE, BUN, CREATININE, CALCIUM, MG, PHOS in the last 168 hours.  Liver Function Tests: No results for input(s): AST, ALT, ALKPHOS, BILITOT, PROT, ALBUMIN in the last 168 hours. No results for input(s): LIPASE, AMYLASE in the last 168 hours. No results for input(s): AMMONIA in the last 168 hours.  CBC: No results for input(s): WBC, NEUTROABS, HGB, HCT, MCV, PLT in the last 168 hours.  Cardiac Enzymes: No results for input(s): CKTOTAL, CKMB, CKMBINDEX, TROPONINI in the last 168 hours.  BNP: Invalid input(s): POCBNP  CBG:  Recent Labs Lab 12/16/15 1204  GLUCAP 65    Microbiology: Results for orders placed or performed during the hospital encounter of 07/24/14  Wet prep, genital     Status: Abnormal   Collection Time: 07/24/14 10:00 PM  Result Value Ref Range Status   Yeast Wet Prep HPF POC NONE SEEN NONE SEEN Final   Trich, Wet Prep NONE SEEN NONE SEEN Final   Clue Cells Wet Prep HPF POC NONE SEEN NONE SEEN Final   WBC, Wet Prep HPF POC FEW (A) NONE SEEN Final    Comment: MANY BACTERIA SEEN  GC/Chlamydia Probe Amp     Status: None   Collection Time: 07/24/14  10:00 PM  Result Value Ref Range Status   CT Probe RNA NEGATIVE NEGATIVE Final   GC Probe RNA NEGATIVE NEGATIVE Final    Comment: (NOTE)                                                                                       **Normal Reference Range: Negative**      Assay performed using the Gen-Probe APTIMA COMBO2 (R) Assay. Acceptable specimen types for this assay include APTIMA Swabs (Unisex, endocervical, urethral, or vaginal), first void urine, and ThinPrep liquid based cytology samples. Performed at [facility]    Coagulation  Studies: No results for input(s): LABPROT, INR in the last 72 hours.  Urinalysis: No results for input(s): COLORURINE, LABSPEC, PHURINE, GLUCOSEU, HGBUR, BILIRUBINUR, KETONESUR, PROTEINUR, UROBILINOGEN, NITRITE, LEUKOCYTESUR in the last 168 hours.  Invalid input(s): APPERANCEUR  Lipid Panel: No results found for: CHOL, TRIG, HDL, CHOLHDL, VLDL, LDLCALC  HgbA1C: No results found for: HGBA1C  Urine Drug Screen:  No results found for: LABOPIA, COCAINSCRNUR, LABBENZ, AMPHETMU, THCU, LABBARB  Alcohol Level: No results for input(s): ETH in the last 168 hours.  Other results: EKG: sinus tachycardia at 100 bpm.  Imaging: Ct Head Wo Contrast  12/16/2015  CLINICAL DATA:  Acute right-sided facial droop with headache EXAM: CT HEAD WITHOUT CONTRAST TECHNIQUE: Contiguous axial images were obtained from the base of the skull through the vertex without intravenous contrast. COMPARISON:  None. FINDINGS: The ventricles are normal in size and configuration. There is no intracranial mass, hemorrhage, extra-axial fluid collection, or midline shift. Gray-white compartments appear normal. No acute infarct is evident. Bony calvarium appears intact. The mastoid air cells clear. Intraorbital structures appear symmetric bilaterally. Middle cerebral artery attenuation is symmetric bilaterally. IMPRESSION: Study within normal limits. Critical Value/emergent results were called by telephone at the time of interpretation on 12/16/2015 at 12:03 pm to Dr. Toney Rakes , who verbally acknowledged these results. Electronically Signed   By: Bretta Bang III M.D.   On: 12/16/2015 12:04    Assessment: 25 y.o. female presenting with headache, right facial droop and dysarthria.  NIHSS minimal at 3.  Suspect deficits related to headache and do not represent an acute infarct.  Head CT personally reviewed and shows no acute changes.  Due to possible posterior circulation involvement and to rule out possible aneurysm, further work up  recommended.    Stroke Risk Factors - none  Plan: 1. CTA of the head and neck 2. NPO until RN stroke swallow screen 3. Telemetry monitoring 4. Frequent neuro checks   Case discussed with Dr. Abagail Kitchens, MD Neurology [phone] 12/16/2015, 12:19 PM  Addendum: CTA of head and neck reviewed.  No abnormalities noted.    Recommendations: 1.  ASA daily 2.  Headache cocktail 3.  Follow up with neurology as an outpatient for long term headache management   Thana Farr, MD Neurology [phone]

## 2015-12-16 NOTE — ED Notes (Signed)
Reported FSBS of 47 to Dr Fanny Bien and he requested Pt drink cup of orange juice - Pt drank cup of Orange juice at this time

## 2015-12-16 NOTE — ED Notes (Signed)
Pt was at work and was not feeling well.  Says headache.  And feels like a cramp in right eye.  Keeps blinking eyes.  Right side face is slight droop

## 2015-12-16 NOTE — ED Provider Notes (Signed)
[facility] Emergency Department Provider Note  ____________________________________________  Time seen: Approximately 12:03 PM  I have reviewed the triage vital signs and the nursing notes.   HISTORY  Chief Complaint Headache    HPI Wendy Richards is a 25 y.o. female who reports she is at work not feeling well. Patient began to develop a moderate to severe headache with associated drooping of the right side of the face. She also reported some weakness on the right side.  Patient does not have a history of previous stroke. She does have a history of panic attacks, and an aunt who died from an aneurysm in her 10s.  No fevers or chills. No chest pain or trouble breathing. She reports feeling very anxious and having a moderate to severe headache at this time.  Mother does report she has been having intermittent headaches for about the last month or 2.   Past Medical History  Diagnosis Date  . Tachycardia   . Panic attacks   . SOB (shortness of breath)   . Hyperthyroidism   . Interstitial cystitis   . Interstitial cystitis     Patient Active Problem List   Diagnosis Date Noted  . Tachycardia 10/15/2012  . Palpitations 10/15/2012  . Panic anxiety syndrome 10/15/2012    Past Surgical History  Procedure Laterality Date  . Breast enhancement surgery    . External ear surgery    . Rhinoplasty      Current Outpatient Rx  Name  Route  Sig  Dispense  Refill  . ALPRAZolam (XANAX) 0.25 MG tablet   Oral   Take 0.25 mg by mouth daily as needed for anxiety.         Marland Kitchen aspirin 325 MG tablet   Oral   Take 650 mg by mouth every 6 (six) hours as needed for mild pain or headache.          . norgestrel-ethinyl estradiol (LO/OVRAL,CRYSELLE) 0.3-30 MG-MCG tablet   Oral   Take 1 tablet by mouth daily.         Marland Kitchen zolpidem (AMBIEN) 10 MG tablet   Oral   Take 10 mg by mouth at bedtime as needed for sleep.           Allergies Review of  patient's allergies indicates no known allergies.  Family History  Problem Relation Age of Onset  . Hypertension Maternal Grandfather   . Cancer Maternal Grandfather     died of non hodgkins lymphoma  . Heart attack Paternal Grandfather   . Cancer Maternal Grandmother     uterine cancer  . Ovarian cysts Mother     Social History Social History  Substance Use Topics  . Smoking status: Never Smoker   . Smokeless tobacco: Never Used  . Alcohol Use: Yes     Comment: moderate social drug    Review of Systems Constitutional: No fever/chills Eyes: No visual changes. ENT: No sore throat. Cardiovascular: Denies chest pain. Respiratory: Denies shortness of breath. Gastrointestinal: No abdominal pain.  No nausea, no vomiting.  No diarrhea.  No constipation. Genitourinary: Negative for dysuria. Musculoskeletal: Negative for back pain. Skin: Negative for rash. Neurological: See history of present illness. Also reports some trouble speaking.  10-point ROS otherwise negative.  ____________________________________________   PHYSICAL EXAM:  VITAL SIGNS: ED Triage Vitals  Enc Vitals Group     BP 12/16/15 1134 133/93 mmHg     Pulse Rate 12/16/15 1134 108     Resp 12/16/15 1134 18  Temp 12/16/15 1134 98.6 F (37 C)     Temp Source 12/16/15 1134 Oral     SpO2 12/16/15 1134 100 %     Weight 12/16/15 1134 139 lb (63.05 kg)     Height 12/16/15 1134 5\' 6"  (1.676 m)     Head Cir --      Peak Flow --      Pain Score 12/16/15 1134 10     Pain Loc --      Pain Edu? --      Excl. in GC? --    Constitutional: Alert and oriented. Appears tremulous and very anxious. Eyes: Conjunctivae are normal. PERRL. EOMI. Head: Atraumatic. Nose: No congestion/rhinnorhea. Mouth/Throat: Mucous membranes are moist.  Oropharynx non-erythematous. Neck: No stridor.   Cardiovascular: Normal rate, regular rhythm. Grossly normal heart sounds.  Good peripheral circulation. Respiratory: Normal  respiratory effort.  No retractions. Lungs CTAB. Gastrointestinal: Soft and nontender. No distention. No abdominal bruits. No CVA tenderness. Musculoskeletal: No lower extremity tenderness nor edema.  No joint effusions. Neurologic:    NIH score equals 3, performed by me at bedside. The patient has no pronator drift. The patient has normal cranial nerve exam except for a right-sided facial droop and decreased sensation over the right face. Extraocular movements are normal. Visual fields are normal. Patient has 5 out of 5 strength in all extremities. There is no numbness or gross, acute sensory abnormality in the extremities bilaterally. Mild dysarthria. No aphasia. No ataxia. Normal finger nose finger bilat.  Skin:  Skin is warm, dry and intact. No rash noted. Psychiatric: Mood and affect are normal. Speech and behavior are normal.  ____________________________________________   LABS (all labs ordered are listed, but only abnormal results are displayed)  Labs Reviewed  COMPREHENSIVE METABOLIC PANEL - Abnormal; Notable for the following:    Creatinine, Ser 1.04 (*)    AST 62 (*)    Total Bilirubin 1.3 (*)    All other components within normal limits  GLUCOSE, CAPILLARY - Abnormal; Notable for the following:    Glucose-Capillary 57 (*)    All other components within normal limits  PROTIME-INR  APTT  CBC  DIFFERENTIAL  GLUCOSE, CAPILLARY  TSH  [facility] ONLY)  GLUCOSE, CAPILLARY  CBG MONITORING, ED  CBG MONITORING, ED  CBG MONITORING, ED   ____________________________________________  EKG  ED ECG REPORT I, Teriah Muela, the attending physician, personally viewed and interpreted this ECG.  Date: 12/16/2015 EKG Time: 1205 Rate: 100 Rhythm: normal sinus rhythm QRS Axis: normal Intervals: normal ST/T Wave abnormalities: normal Conduction Disturbances: none Narrative Interpretation: unremarkable  ____________________________________________  RADIOLOGY  MR  Brain Wo Contrast (Final result) Result time: 12/16/15 14:50:28   Final result by Rad Results In Interface (12/16/15 14:50:28)   Narrative:   CLINICAL DATA: 25 year old female with severe headache, dysarthria, right hemi paresis, malaise, orbital pain. Initial encounter.  EXAM: MRI HEAD WITHOUT CONTRAST  TECHNIQUE: Multiplanar, multiecho pulse sequences of the brain and surrounding structures were obtained without intravenous contrast.  COMPARISON: CTA head and neck 1232 hours today.  FINDINGS: No restricted diffusion to suggest acute infarction. No midline shift, mass effect, evidence of mass lesion, ventriculomegaly, extra-axial collection or acute intracranial hemorrhage. Cervicomedullary junction and pituitary are within normal limits. Negative visualized cervical spine. Major intracranial vascular flow voids are within normal limits.  Wallace Cullens and white matter signal is within normal limits throughout the brain. No encephalomalacia or chronic cerebral blood products.  Visible internal auditory structures appear normal. Mastoids are clear. Paranasal  sinuses are clear. Negative orbit and scalp soft tissues  IMPRESSION: Normal noncontrast Normal MRI appearance of the brain.   Electronically Signed By: Odessa Fleming M.D. On: 12/16/2015 14:50      CT Angio Head W/Cm &/Or Wo Cm (Final result) Result time: 12/16/15 12:53:05   Final result by Rad Results In Interface (12/16/15 12:53:05)   Narrative:   CLINICAL DATA: 25 year old female with severe headache, dysarthria, right hemi paresis, malaise. Orbital pain. Initial encounter.  EXAM: CT ANGIOGRAPHY HEAD AND NECK  TECHNIQUE: Multidetector CT imaging of the head and neck was performed using the standard protocol during bolus administration of intravenous contrast. Multiplanar CT image reconstructions and MIPs were obtained to evaluate the vascular anatomy. Carotid stenosis measurements (when applicable) are  obtained utilizing NASCET criteria, using the distal internal carotid diameter as the denominator.  CONTRAST: 75mL OMNIPAQUE IOHEXOL 350 MG/ML SOLN  COMPARISON: Head CT without contrast 1148 hours today.  FINDINGS: CTA NECK  Skeleton: Mild reversal of cervical lordosis. No acute osseous abnormality identified. Visualized paranasal sinuses and mastoids are clear.  Other neck: Negative lung apices. No superior mediastinal lymphadenopathy.  Thyroid, larynx, pharynx, parapharyngeal spaces, retropharyngeal space, sublingual space, submandibular glands, and parotid glands are within normal limits. Bilateral cervical lymph nodes are within normal limits for age.  Aortic arch: 3 vessel arch configuration with no arch atherosclerosis and normal great vessel origins.  Right carotid system: Negative.  Left carotid system: Negative.  Vertebral arteries:Negative. Codominant vertebral arteries.  CTA HEAD  Posterior circulation: Normal distal vertebral arteries. Normal PICA origins. Patent vertebrobasilar junction. Normal AICA origins. Normal basilar artery. SCA and PCA origins are normal. Posterior communicating arteries are diminutive or absent. Bilateral PCA branches are within normal limits.  Anterior circulation: Both ICA siphons appear normal. Normal ophthalmic artery origins. Normal carotid termini. Normal MCA and ACA origins. Diminutive or absent anterior communicating artery. Bilateral ACA branches appear normal. Left MCA M1 segment, bifurcation, and left MCA branches are within normal limits. Right MCA M1 segment, bifurcation, and right MCA branches are within normal limits.  Venous sinuses: Patent.  Anatomic variants: None.  Delayed phase: No abnormal enhancement identified.  IMPRESSION: Negative CTA head and neck. Stable and negative CT appearance of the brain.   Electronically Signed By: Odessa Fleming M.D. On: 12/16/2015 12:53          CT Angio Neck  W/Cm &/Or Wo/Cm (Final result) Result time: 12/16/15 12:53:05   Final result by Rad Results In Interface (12/16/15 12:53:05)   Narrative:   CLINICAL DATA: 25 year old female with severe headache, dysarthria, right hemi paresis, malaise. Orbital pain. Initial encounter.  EXAM: CT ANGIOGRAPHY HEAD AND NECK  TECHNIQUE: Multidetector CT imaging of the head and neck was performed using the standard protocol during bolus administration of intravenous contrast. Multiplanar CT image reconstructions and MIPs were obtained to evaluate the vascular anatomy. Carotid stenosis measurements (when applicable) are obtained utilizing NASCET criteria, using the distal internal carotid diameter as the denominator.  CONTRAST: 75mL OMNIPAQUE IOHEXOL 350 MG/ML SOLN  COMPARISON: Head CT without contrast 1148 hours today.  FINDINGS: CTA NECK  Skeleton: Mild reversal of cervical lordosis. No acute osseous abnormality identified. Visualized paranasal sinuses and mastoids are clear.  Other neck: Negative lung apices. No superior mediastinal lymphadenopathy.  Thyroid, larynx, pharynx, parapharyngeal spaces, retropharyngeal space, sublingual space, submandibular glands, and parotid glands are within normal limits. Bilateral cervical lymph nodes are within normal limits for age.  Aortic arch: 3 vessel arch configuration with no arch atherosclerosis and normal  great vessel origins.  Right carotid system: Negative.  Left carotid system: Negative.  Vertebral arteries:Negative. Codominant vertebral arteries.  CTA HEAD  Posterior circulation: Normal distal vertebral arteries. Normal PICA origins. Patent vertebrobasilar junction. Normal AICA origins. Normal basilar artery. SCA and PCA origins are normal. Posterior communicating arteries are diminutive or absent. Bilateral PCA branches are within normal limits.  Anterior circulation: Both ICA siphons appear normal. Normal ophthalmic artery  origins. Normal carotid termini. Normal MCA and ACA origins. Diminutive or absent anterior communicating artery. Bilateral ACA branches appear normal. Left MCA M1 segment, bifurcation, and left MCA branches are within normal limits. Right MCA M1 segment, bifurcation, and right MCA branches are within normal limits.  Venous sinuses: Patent.  Anatomic variants: None.  Delayed phase: No abnormal enhancement identified.  IMPRESSION: Negative CTA head and neck. Stable and negative CT appearance of the brain.   Electronically Signed By: Odessa Fleming M.D. On: 12/16/2015 12:53          CT Head Wo Contrast (Final result) Result time: 12/16/15 12:04:24   Final result by Rad Results In Interface (12/16/15 12:04:24)   Narrative:   CLINICAL DATA: Acute right-sided facial droop with headache  EXAM: CT HEAD WITHOUT CONTRAST  TECHNIQUE: Contiguous axial images were obtained from the base of the skull through the vertex without intravenous contrast.  COMPARISON: None.  FINDINGS: The ventricles are normal in size and configuration. There is no intracranial mass, hemorrhage, extra-axial fluid collection, or midline shift. Gray-white compartments appear normal. No acute infarct is evident. Bony calvarium appears intact. The mastoid air cells clear. Intraorbital structures appear symmetric bilaterally. Middle cerebral artery attenuation is symmetric bilaterally.  IMPRESSION: Study within normal limits.  Critical Value/emergent results were called by telephone at the time of interpretation on 12/16/2015 at 12:03 pm to Dr. Toney Rakes , who verbally acknowledged these results.   Electronically Signed By: Bretta Bang III M.D. On: 12/16/2015 12:04    ____________________________________________   PROCEDURES  Procedure(s) performed: None  Critical Care performed: Yes, see critical care note(s)  CRITICAL CARE Performed by: Sharyn Creamer   Total critical care  time: 42 minutes  Critical care time was exclusive of separately billable procedures and treating other patients.  Critical care was necessary to treat or prevent imminent or life-threatening deterioration.  Critical care was time spent personally by me on the following activities: development of treatment plan with patient and/or surrogate as well as nursing, discussions with consultants, evaluation of patient's response to treatment, examination of patient, obtaining history from patient or surrogate, ordering and performing treatments and interventions, ordering and review of laboratory studies, ordering and review of radiographic studies, pulse oximetry and re-evaluation of patient's condition.  Patient required stroke activation and presents with acute neurologic deficit requiring emergent evaluation to exclude acute vascular lesion, aneurysm, or acute stroke. ____________________________________________   INITIAL IMPRESSION / ASSESSMENT AND PLAN / ED COURSE  Pertinent labs & imaging results that were available during my care of the patient were reviewed by me and considered in my medical decision making (see chart for details).   ----------------------------------------- 12:06 PM on 12/16/2015 -----------------------------------------  Code stroke process initiated on arrival. Dr. Thad Ranger of neurology service seeing patient in conjunction with myself. Patient presents with acute right-sided deficits associated with moderate headache. The patient does have a history of recent headaches. Her examination is concerning but given her associated headache feel most likely complex migraine, far less likely to be aneurysm in the setting of a normal noncontrasted head CT performed  within 3 hours of symptom presentation, or acute stroke.  Discussed with Dr. Thad Ranger of neurology at the bedside and we will obtain CT angiography of the head and neck to exclude acute vascular lesion or aneurysm. The  patient denies pregnancy, has no history of renal disease, is young and healthy, and given the acuity of this condition and need to rule out acute aneurysm or vascular occlusion the risks of CT angiography are felt to be strongly outweighed by the benefits in the acute setting. We'll proceed with CT angio. Patient reports last [facility]. Less than 2 weeks ago, and she denies pregnancy.  We'll treat headache with Reglan and Benadryl as discussed and advised with Dr. Thad Ranger.  ----------------------------------------- 1:21 PM on 12/16/2015 -----------------------------------------  CT angiography negative. Dr. Thad Ranger at bedside again. Reexamined, patient's facial droop notably improved and she reports numbness over the right-sided face is also improving. Her speech is now clear. Overall patient showing improvement states her headache is much better. No evidence to support aneurysmal rupture at this time. I suspect is likely a complex headache/migraine type presentation.  ----------------------------------------- 3:52 PM on 12/16/2015 -----------------------------------------  Patient reports headache and all symptoms resolved. Repeat exam no evidence facial droop, trouble speaking or other concerns. Plan to discharge the patient, she'll start a baby aspirin daily and follow-up with neurology, Dr. Sherryll Burger. Careful return precautions advised.  Presentation appears consistent with complex migraine. ____________________________________________   FINAL CLINICAL IMPRESSION(S) / ED DIAGNOSES  Final diagnoses:  Complicated migraine      Sharyn Creamer, MD 12/16/15 1556

## 2015-12-16 NOTE — Discharge Instructions (Signed)
You have been seen in the Emergency Department (ED) for a headache.  Please start a baby aspirin daily ( ).  As we have discussed, please follow up with your primary care doctor and neurology as soon as possible regarding todays Emergency Department (ED) visit and your headache symptoms.    Call your doctor or return to the ED if you have a worsening headache, sudden and severe headache, confusion, slurred speech, facial droop, weakness or numbness in any arm or leg, extreme fatigue, vision problems, or other symptoms that concern you.   Migraine Headache A migraine headache is an intense, throbbing pain on one or both sides of your head. A migraine can last for 30 minutes to several hours. CAUSES  The exact cause of a migraine headache is not always known. However, a migraine may be caused when nerves in the brain become irritated and release chemicals that cause inflammation. This causes pain. Certain things may also trigger migraines, such as:  Alcohol.  Smoking.  Stress.  Menstruation.  Aged cheeses.  Foods or drinks that contain nitrates, glutamate, aspartame, or tyramine.  Lack of sleep.  Chocolate.  Caffeine.  Hunger.  Physical exertion.  Fatigue.  Medicines used to treat chest pain (nitroglycerine), birth control pills, estrogen, and some blood pressure medicines. SIGNS AND SYMPTOMS  Pain on one or both sides of your head.  Pulsating or throbbing pain.  Severe pain that prevents daily activities.  Pain that is aggravated by any physical activity.  Nausea, vomiting, or both.  Dizziness.  Pain with exposure to bright lights, loud noises, or activity.  General sensitivity to bright lights, loud noises, or smells. Before you get a migraine, you may get warning signs that a migraine is coming (aura). An aura may include:  Seeing flashing lights.  Seeing bright spots, halos, or zigzag lines.  Having tunnel vision or blurred vision.  Having feelings  of numbness or tingling.  Having trouble talking.  Having muscle weakness. DIAGNOSIS  A migraine headache is often diagnosed based on:  Symptoms.  Physical exam.  A CT scan or MRI of your head. These imaging tests cannot diagnose migraines, but they can help rule out other causes of headaches. TREATMENT Medicines may be given for pain and nausea. Medicines can also be given to help prevent recurrent migraines.  HOME CARE INSTRUCTIONS  Only take over-the-counter or prescription medicines for pain or discomfort as directed by your health care provider. The use of long-term narcotics is not recommended.  Lie down in a dark, quiet room when you have a migraine.  Keep a journal to find out what may trigger your migraine headaches. For example, write down:  What you eat and drink.  How much sleep you get.  Any change to your diet or medicines.  Limit alcohol consumption.  Quit smoking if you smoke.  Get 7-9 hours of sleep, or as recommended by your health care provider.  Limit stress.  Keep lights dim if bright lights bother you and make your migraines worse. SEEK IMMEDIATE MEDICAL CARE IF:   Your migraine becomes severe.  You have a fever.  You have a stiff neck.  You have vision loss.  You have muscular weakness or loss of muscle control.  You start losing your balance or have trouble walking.  You feel faint or pass out.  You have severe symptoms that are different from your first symptoms. MAKE SURE YOU:   Understand these instructions.  Will watch your condition.  Will get help  right away if you are not doing well or get worse.   This information is not intended to replace advice given to you by your health care provider. Make sure you discuss any questions you have with your health care provider.   Document Released: 10/22/2005 Document Revised: 11/12/2014 Document Reviewed: 06/29/2013 Elsevier Interactive Patient Education [employer].

## 2016-01-03 ENCOUNTER — Ambulatory Visit (INDEPENDENT_AMBULATORY_CARE_PROVIDER_SITE_OTHER): Payer: 59 | Admitting: Neurology

## 2016-01-03 ENCOUNTER — Encounter: Payer: Self-pay | Admitting: Neurology

## 2016-01-03 VITALS — BP 106/64 | HR 77 | Ht 66.0 in | Wt 138.6 lb

## 2016-01-03 DIAGNOSIS — G43109 Migraine with aura, not intractable, without status migrainosus: Secondary | ICD-10-CM

## 2016-01-03 DIAGNOSIS — G40109 Localization-related (focal) (partial) symptomatic epilepsy and epileptic syndromes with simple partial seizures, not intractable, without status epilepticus: Secondary | ICD-10-CM

## 2016-01-03 DIAGNOSIS — G43909 Migraine, unspecified, not intractable, without status migrainosus: Secondary | ICD-10-CM

## 2016-01-03 DIAGNOSIS — R110 Nausea: Secondary | ICD-10-CM | POA: Diagnosis not present

## 2016-01-03 MED ORDER — ONDANSETRON 4 MG PO TBDP: 4.0000 mg | ORAL_TABLET | Freq: Three times a day (TID) | ORAL | Status: DC | PRN

## 2016-01-03 MED ORDER — DICLOFENAC POTASSIUM(MIGRAINE) 50 MG PO PACK: 50.0000 mg | PACK | Freq: Once | ORAL | Status: DC | PRN

## 2016-01-03 MED ORDER — AMITRIPTYLINE HCL 25 MG PO TABS: 50.0000 mg | ORAL_TABLET | Freq: Every day | ORAL | Status: DC

## 2016-01-03 NOTE — Patient Instructions (Addendum)
Remember to drink plenty of fluid, eat healthy meals and do not skip any meals. Try to eat protein with a every meal and eat a healthy snack such as fruit or nuts in between meals. Try to keep a regular sleep-wake schedule and try to exercise daily, particularly in the form of walking, 20-30 minutes a day, if you can.  EEG.  As far as your medications are concerned, I would like to suggest: Cambia and Zofran at the onset of headache. Can repeat in 2 hours.  Amitriptyline: Weeks 1-2: 1/2 tab at night Weeks 3-4: whole tablet night Week 5-6: 1.5 tablets at night Week 7-8: 2 tablets at night  Our phone number is [phone]. We also have an after hours call service for urgent matters and there is a physician on-call for urgent questions. For any emergencies you know to call 911 or go to the nearest emergency room

## 2016-01-03 NOTE — Progress Notes (Signed)
[facility]    Provider:  Dr Lucia Gaskins Referring Provider: Richardean Chimera, MD Primary Care Physician:  Juluis Mire, MD  CC:  Migraines  HPI:  Wendy Richards is a 25 y.o. female here as a referral from Dr. Arelia Sneddon for Migraines with right-sided hemiplegia. Started a few months ago. It started on the right side, like a hot knife in back of the right head, sharp, lasted an hour to two hours. When it was in the hospital it was burning and radiating just in the right spot. Pulsating, throbbing in the back of the head. She has a headache every day now been going on for 3 months. These headaches are low level and throbbing in the back of the head contnuous 24x7 and never goes away a 3-4/10 in pain. Then she has migraines on top of them, they were so bad she had to be put in a wheelchair, they thought she was having a stroke, she had right sided facial weakness and "weird posturing" on the right side. She hs only had 2 severe migraines like this. She went to the ED for one and she had to leave work for another one. She didn't want to move it hurt so bad. She has light sensitivity, sound sensitivity, cognitive problems, nausea.  Migraines always start on the right side. With both she has right-sided symptoms and the whole incident lasted about an hour or so. She can feel it coming and she starts panicking. She has some auras of lights in her vision. She has insomnia and difficulty sleeping. She keeps hearing wind sounds in her ears. Mother had some headaches in her 64s where she would see flashes of light. She has anxiety an is on Xanax and she was having some financial stress when the migraines started. She is not taking OTC medications. She goes through spells of being sad but denies depression. She was also repetitive motor movements.   Reviewed notes, labs and imaging from outside physicians, which showed: Personally reviewed images and agree with the following:  MRI of the brain  12/16/2015: No restricted diffusion to suggest acute infarction. No midline shift, mass effect, evidence of mass lesion, ventriculomegaly, extra-axial collection or acute intracranial hemorrhage. Cervicomedullary junction and pituitary are within normal limits. Negative visualized cervical spine. Major intracranial vascular flow voids are within normal limits.  Wallace Cullens and white matter signal is within normal limits throughout the brain. No encephalomalacia or chronic cerebral blood products.  Visible internal auditory structures appear normal. Mastoids are clear. Paranasal sinuses are clear. Negative orbit and scalp soft tissues  IMPRESSION: Normal noncontrast Normal MRI appearance of the brain.  CT of the head 12/2025 showed No acute intracranial abnormalities including mass lesion or mass effect, hydrocephalus, extra-axial fluid collection, midline shift, hemorrhage, or acute infarction, large ischemic events (personally reviewed images)  CTA head and neck:: Negative CTA head and neck. Stable and negative CT appearance of the brain  Reviewed Epic notes. She was seen at [facility] for acute right-sided facial droop. Notes stated acute onset of headache and right sided facial droop/dysarthria, headache occipital on the right and radiating upward. She was 10/10 in pain. She had no associated nausea or vomiting. Had NIHSS 3. She was noted to have right spasmodic ptosis, some slurring of speech but intact cognition, mild right NLF and decreased sensationon the right. Diagnosed with complicated migraines.     Review of Systems: Patient complains of symptoms per HPI as well as the following symptoms: No CP, no SOB.  Fevers chills, weight loss, fatigue, blurred vision, eye pain, easy bruising, chest pain, palpitations, feeling hot, feeling cold, increased thirst, ringing in ears, spinning sensation, joint pain, aching muscles, rash, itching, memory loss, confusion, headache, weakness, slurred speech,  dizziness, insomnia, restless legs, anxiety, not enough sleep, decreased energy, change in appetite, disinterest in activities, hallucinations, racing thoughts Pertinent negatives per HPI. All others negative.   Social History   Social History  . Marital Status: Single    Spouse Name: N/A  . Number of Children: 0  . Years of Education: 14   Occupational History  . [employer]    Social History Main Topics  . Smoking status: Never Smoker   . Smokeless tobacco: Never Used  . Alcohol Use: Yes     Comment: moderate social drug  . Drug Use: No  . Sexual Activity: Yes    Birth Control/ Protection: Pill   Other Topics Concern  . Not on file   Social History Narrative   Pt lives in [place] with her mother.   Fulltime employee at [employer] at a [employer]       Family History  Problem Relation Age of Onset  . Hypertension Maternal Grandfather   . Cancer Maternal Grandfather     died of non hodgkins lymphoma  . Heart attack Paternal Grandfather   . Cancer Maternal Grandmother     uterine cancer  . Ovarian cysts Mother   . Migraines Mother     Past Medical History  Diagnosis Date  . Tachycardia   . Panic attacks   . SOB (shortness of breath)   . Hyperthyroidism   . Interstitial cystitis   . Interstitial cystitis     Past Surgical History  Procedure Laterality Date  . Breast enhancement surgery    . External ear surgery    . Rhinoplasty      Current Outpatient Prescriptions  Medication Sig Dispense Refill  . ALPRAZolam (XANAX) 0.25 MG tablet Take 0.25 mg by mouth daily as needed for anxiety.    Marland Kitchen zolpidem (AMBIEN) 10 MG tablet Take 10 mg by mouth at bedtime as needed for sleep.    Marland Kitchen amitriptyline (ELAVIL) 25 MG tablet Take 2 tablets (50 mg total) by mouth at bedtime. Slowly titrate up as directed. 60 tablet 12  . aspirin 325 MG tablet Take 650 mg by mouth every 6 (six) hours as needed for mild pain or headache. Reported on 01/03/2016    .  Diclofenac Potassium 50 MG PACK Take 50 mg by mouth once as needed. Take once daily as needed with headache onset. Please take with food 9 each 0  . ondansetron (ZOFRAN ODT) 4 MG disintegrating tablet Take 1 tablet (4 mg total) by mouth every 8 (eight) hours as needed for nausea or vomiting. 20 tablet 12   No current facility-administered medications for this visit.    Allergies as of 01/03/2016 - Review Complete 01/03/2016  Allergen Reaction Noted  . Benadryl [diphenhydramine hcl (sleep)]  01/03/2016    Vitals: BP 106/64 mmHg  Pulse 77  Ht 5\' 6"  (1.676 m)  Wt 138 lb 9.6 oz (62.869 kg)  BMI 22.38 kg/m2  LMP 11/30/2015 Last Weight:  Wt Readings from Last 1 Encounters:  01/03/16 138 lb 9.6 oz (62.869 kg)   Last Height:   Ht Readings from Last 1 Encounters:  01/03/16 5\' 6"  (1.676 m)    Physical exam: Exam: Gen: NAD, conversant, well nourised, obese, well groomed  CV: RRR, no MRG. No Carotid Bruits. No peripheral edema, warm, nontender Eyes: Conjunctivae clear without exudates or hemorrhage  Neuro: Detailed Neurologic Exam  Speech:    Speech is normal; fluent and spontaneous with normal comprehension.  Cognition:    The patient is oriented to person, place, and time;     recent and remote memory intact;     language fluent;     normal attention, concentration,     fund of knowledge Cranial Nerves:    The pupils are equal, round, and reactive to light. The fundi are normal and spontaneous venous pulsations are present. Visual fields are full to finger confrontation. Extraocular movements are intact. Trigeminal sensation is intact and the muscles of mastication are normal. The face is symmetric. The palate elevates in the midline. Hearing intact. Voice is normal. Shoulder shrug is normal. The tongue has normal motion without fasciculations.   Coordination:    Normal finger to nose and heel to shin. Normal rapid alternating movements.   Gait:    Heel-toe  and tandem gait are normal.   Motor Observation:    No asymmetry, no atrophy, and no involuntary movements noted. Tone:    Normal muscle tone.    Posture:    Posture is normal. normal erect    Strength:    Strength is V/V in the upper and lower limbs.      Sensation: intact to LT     Reflex Exam:  DTR's:    Deep tendon reflexes in the upper and lower extremities are normal bilaterally.   Toes:    The toes are downgoing bilaterally.   Clonus:    Clonus is absent.      Assessment/Plan:  25 year old with daily occipital headaches and migraines with aura and right-sided facial droop and sensory loss, insomnia.  Remember to drink plenty of fluid, eat healthy meals and do not skip any meals. Try to eat protein with a every meal and eat a healthy snack such as fruit or nuts in between meals. Try to keep a regular sleep-wake schedule and try to exercise daily, particularly in the form of walking, 20-30 minutes a day, if you can.   EEG due to the episodic right-sided motor symptoms however symptoms occur with migraines so likely complicated migraine.   As far as your medications are concerned, I would like to suggest: Cambia and Zofran at the onset of headache. Can repeat in 2 hours.  Amitriptyline for migraine an headache prevention. May also help insomnia.: Weeks 1-2: 1/2 tab at night Weeks 3-4: whole tablet night Week 5-6: 1.5 tablets at night Week 7-8: 2 tablets at night  To prevent or relieve headaches, try the following: Cool Compress. Lie down and place a cool compress on your head.  Avoid headache triggers. If certain foods or odors seem to have triggered your migraines in the past, avoid them. A headache diary might help you identify triggers.  Include physical activity in your daily routine. Try a daily walk or other moderate aerobic exercise.  Manage stress. Find healthy ways to cope with the stressors, such as delegating tasks on your to-do list.  Practice relaxation  techniques. Try deep breathing, yoga, massage and visualization.  Eat regularly. Eating regularly scheduled meals and maintaining a healthy diet might help prevent headaches. Also, drink plenty of fluids.  Follow a regular sleep schedule. Sleep deprivation might contribute to headaches Consider biofeedback. With this mind-body technique, you learn to control certain bodily functions - such  as muscle tension, heart rate and blood pressure - to prevent headaches or reduce headache pain.    Proceed to emergency room if you experience new or worsening symptoms or symptoms do not resolve, if you have new neurologic symptoms or if headache is severe, or for any concerning symptom.    Naomie Dean, MD  [facility] [address] [place], [place] [address]  Phone [phone] Fax [phone]

## 2016-01-06 ENCOUNTER — Encounter: Payer: Self-pay | Admitting: Neurology

## 2016-01-06 DIAGNOSIS — G43109 Migraine with aura, not intractable, without status migrainosus: Secondary | ICD-10-CM | POA: Insufficient documentation

## 2016-01-09 ENCOUNTER — Encounter: Payer: Self-pay | Admitting: *Deleted

## 2016-01-09 NOTE — Progress Notes (Signed)
Sent completed cambia enrollment form to [facility]. Received confirmation. Fax: [phone]. Sent copy to MR.

## 2016-01-09 NOTE — Progress Notes (Signed)
Received prescription status form that [facility]. Received referral. They are processing request.

## 2016-01-12 ENCOUNTER — Telehealth: Payer: Self-pay | Admitting: Neurology

## 2016-01-12 NOTE — Telephone Encounter (Signed)
LVM that its ok for pt to be on birth control pills per Dr Lucia GaskinsAhern. Gave [facility] phone number if she has further questions.

## 2016-01-12 NOTE — Telephone Encounter (Signed)
Wendy AyeKristy with Dr Arelia SneddonMcComb office [phone] ext [phone]( can leave info on VM if she doesn't answer)- he is wanting to know if it's ok for pt to be on birth control pills

## 2016-01-17 ENCOUNTER — Telehealth: Payer: Self-pay | Admitting: *Deleted

## 2016-01-17 NOTE — Telephone Encounter (Signed)
Called [facility] about cambia. Received fax from them that they have attempted to call the pt 2 times to verify shipping address. Gave them pt home number to try as well. They have been trying mobile. They will try to call her again.

## 2016-02-01 ENCOUNTER — Ambulatory Visit (INDEPENDENT_AMBULATORY_CARE_PROVIDER_SITE_OTHER): Payer: 59 | Admitting: Neurology

## 2016-02-01 DIAGNOSIS — G40109 Localization-related (focal) (partial) symptomatic epilepsy and epileptic syndromes with simple partial seizures, not intractable, without status epilepticus: Secondary | ICD-10-CM

## 2016-02-01 NOTE — Procedures (Signed)
     History: Eston MouldMeredith Richards is a 25 year old patient with a history of episodes of headache that may be associated with right-sided facial weakness and posturing of the right side. MRI of the brain has been unremarkable. The patient is being evaluated for these events.  This is a routine EEG. No skull defects are noted. Medications include Xanax, amitriptyline, aspirin, diclofenac, Zofran, and Ambien.  EEG classification: Dysrhythmia grade 2 left temporal  Description of the recording: The background rhythms of this recording consists of a well modulated medium amplitude alpha rhythm of 8 Hz that is reactive to eye opening and closure. As the record progresses, photic stimulation is performed, this results in a bilateral and symmetric photic driving response. Hyperventilation is then performed, this results in a minimal buildup of the background rhythm activities without significant slowing seen. Intermittently during the recording, dysrhythmic theta activity is seen emanating from the left mid temporal area, and this is seen on several occasions during the recording. At no time does there appear to be evidence of actual spike or spike-wave discharges. EKG monitor shows no evidence of cardiac rhythm abnormalities with a heart rate of 78.  Impression: This is an abnormal EEG recording secondary to episodic dysrhythmic theta activity emanating from the left temporal region. This study suggests a left brain abnormality, with a possible lowered seizure threshold. No electrographic seizures were recorded during the study.

## 2016-02-07 ENCOUNTER — Telehealth: Payer: Self-pay | Admitting: Neurology

## 2016-02-07 NOTE — Telephone Encounter (Addendum)
Patient returned Emma's call, per Kara MeadEmma, I apologized to patient regarding phone call yesterday, not sure who called, neither Kara MeadEmma or Dr. Lucia GaskinsAhern had called. Advised, Dr. Lucia GaskinsAhern would like for her to come in to review results and has opening Thursday 11:30am if she could arrive 11:15am. Patient advised that she is the only full time person at her job and states that if results are okay and doesn't need medication, then she would prefer to do this over the phone, if there is something based on the results then she will come in. May reach patient at work number today [phone].

## 2016-02-07 NOTE — Telephone Encounter (Signed)
Tried calling pt on work number provided. Got general VM. Called on mobile number. Spoke to pt and advised per Dr Lucia GaskinsAhern that she will call her to discuss EEG results over phone. At that point, she will decide if she wants to bring her in for another appt. Pt gets off work at 6pm but Dr Lucia GaskinsAhern can call her on work number at [phone] if needed. She stated she was given amitriptyline but this makes her very drowsy.  She is drowsy the next day even. She had been given this 2 times previously for bladder condition by her urologist but did not remember until now. Does not think it is helping headaches. She would possibly like to try a different medication if warranted. Verified pt pharmacy while on phone. Advised Dr Lucia GaskinsAhern seeing pt until about 5pm. She will call back to discuss. She verbalized understanding.

## 2016-02-07 NOTE — Telephone Encounter (Signed)
Pt is requesting a call to discuss her EEG and why she was scheduled for another appt. Please call [phone] cell . Thank you

## 2016-02-07 NOTE — Telephone Encounter (Signed)
Called pt back. Please let her know I talked to Dr Lucia GaskinsAhern and neither of us called. Apologize, not sure who called yesterday. But she would like her to come in to review results. She has opening Thursday at 1130am, check in 1115am if she can come then. Let me know, I can place her on the schedule, thank you. LVM for her to call.

## 2016-02-07 NOTE — Telephone Encounter (Signed)
Patient returned Emma's call, advised patient, per Kara MeadEmma, that EEG results are not ready yet, she would let Dr. Lucia GaskinsAhern know that she was calling about them. Advised f/u in May was scheduled at last [facility] for a 3 month f/u. Patient states our office contacted her Mother about an appointment with a different doctor in our office for tomorrow 02/08/16 1:45pm to arrive 1:30pm to go over EEG results (no appt in computer).

## 2016-02-07 NOTE — Telephone Encounter (Signed)
Tried calling pt back. Please let her know EEG results not ready yet and I will let Dr Lucia GaskinsAhern know she is calling about them. The f/u in May was scheduled at [facility] for a 3 month f/u.   LVM for her to call. Gave [facility] phone number.

## 2016-02-11 NOTE — Telephone Encounter (Signed)
Tried calling patient again. Would like to order a 3-day eeg for patient. I'm not sure if this finding is clincially relevant but given the symptoms and correlation on eeg I would like a follow up test. Thanks.   Impression: This is an abnormal EEG recording secondary to episodic dysrhythmic theta activity emanating from the left  temporal region. This study suggests a left brain abnormality, with a possible lowered seizure threshold. No electrographic seizures were recorded during the study.

## 2016-02-14 NOTE — Telephone Encounter (Signed)
Dr Ahern- FYI 

## 2016-02-14 NOTE — Telephone Encounter (Signed)
Wendy Richards, I have tried calling this patient a bunch of times. Can you try? There was no seizure activity seen on eeg or any epileptiform activity to indicate seires. But there was some slowing of brain waves on the left which may mean nothing since her MRI was normal. It may mean nothing. But given her right-sided symptoms (the left side of the brain controls the right side of the body) I would consider a longer eeg like a 3-day home study so we can evaluate longer than an hour. We can discuss at her next appointment if she likes, or I can also get her a sooner appointment. Or we can proceed directly with the home study. It doesn't have to be 3 days but I would like a longer eeg, maybe 24-48 hours to make sure we are not missing anything.

## 2016-02-14 NOTE — Telephone Encounter (Signed)
Called and spoke to pt about Dr Lucia GaskinsAhern results below. Pt verbalized understanding and would like to be scheduled for 24hr in home EEG. Not 72 hr at this point. Explained that [facility] is the company we use to schedule and once we send paperwork to them, they will call her to schedule. She verbalized understanding. I advised Dr Lucia GaskinsAhern out of office until Monday and I will have to wait for her to sign paperwork and I will send once this happens. She understands and is ok with this.

## 2016-02-14 NOTE — Telephone Encounter (Signed)
Thank you emma, this is great. I will sign it on Tuesday thanks

## 2016-02-21 NOTE — Telephone Encounter (Signed)
Faxed completed form to [facility] to schedule pt for in-home 24-hour EEG. Fax: [phone]. Received confirmation.   Received physician status notification from [facility] that they received referral and they will contact us once pt is scheduled for 24hr [facility] EEG.

## 2016-04-04 ENCOUNTER — Ambulatory Visit: Payer: 59 | Admitting: Neurology

## 2016-04-05 ENCOUNTER — Encounter: Payer: Self-pay | Admitting: Neurology

## 2016-04-05 ENCOUNTER — Telehealth: Payer: Self-pay | Admitting: *Deleted

## 2016-04-05 NOTE — Telephone Encounter (Signed)
no showed f/u on 04/04/16

## 2016-04-09 ENCOUNTER — Emergency Department (HOSPITAL_BASED_OUTPATIENT_CLINIC_OR_DEPARTMENT_OTHER)
Admission: EM | Admit: 2016-04-09 | Discharge: 2016-04-09 | Disposition: A | Discharge status: Home / Self Care | Payer: 59 | Attending: Emergency Medicine | Admitting: Emergency Medicine

## 2016-04-09 ENCOUNTER — Encounter (HOSPITAL_BASED_OUTPATIENT_CLINIC_OR_DEPARTMENT_OTHER): Payer: Self-pay | Admitting: *Deleted

## 2016-04-09 DIAGNOSIS — Z7982 Long term (current) use of aspirin: Secondary | ICD-10-CM | POA: Diagnosis not present

## 2016-04-09 DIAGNOSIS — N3289 Other specified disorders of bladder: Secondary | ICD-10-CM | POA: Diagnosis not present

## 2016-04-09 DIAGNOSIS — N390 Urinary tract infection, site not specified: Secondary | ICD-10-CM | POA: Diagnosis not present

## 2016-04-09 DIAGNOSIS — E0590 Thyrotoxicosis, unspecified without thyrotoxic crisis or storm: Secondary | ICD-10-CM | POA: Insufficient documentation

## 2016-04-09 DIAGNOSIS — R1031 Right lower quadrant pain: Secondary | ICD-10-CM | POA: Diagnosis present

## 2016-04-09 LAB — URINALYSIS, ROUTINE W REFLEX MICROSCOPIC
Glucose, UA: NEGATIVE mg/dL
Ketones, ur: NEGATIVE mg/dL
Nitrite: NEGATIVE
Protein, ur: NEGATIVE mg/dL
Specific Gravity, Urine: 1.024 (ref 1.005–1.030)
pH: 6 (ref 5.0–8.0)

## 2016-04-09 LAB — URINE MICROSCOPIC-ADD ON

## 2016-04-09 LAB — PREGNANCY, URINE: Preg Test, Ur: NEGATIVE

## 2016-04-09 MED ORDER — MORPHINE SULFATE (PF) 4 MG/ML IV SOLN
4.0000 mg | Freq: Once | INTRAVENOUS | Status: AC
  Administered 2016-04-09: 4 mg via INTRAMUSCULAR
  Filled 2016-04-09: qty 1

## 2016-04-09 MED ORDER — CEPHALEXIN 500 MG PO CAPS: 500.0000 mg | ORAL_CAPSULE | Freq: Four times a day (QID) | ORAL | Status: DC

## 2016-04-09 MED FILL — CEPHALEXIN 500 MG CAPSULE: 500 | 7 days supply | Qty: 28 | Fill #0

## 2016-04-09 NOTE — ED Notes (Signed)
Right lower quadrant pain since this am. It has gotten worse throughout the day.

## 2016-04-09 NOTE — ED Notes (Signed)
MD at bedside. 

## 2016-04-09 NOTE — Discharge Instructions (Signed)

## 2016-04-09 NOTE — ED Provider Notes (Signed)
CSN: [id number]     Arrival date & time 04/09/16  1629 History   First MD Initiated Contact with Patient 04/09/16 1646     Chief Complaint  Patient presents with  . Abdominal Pain     (Consider location/radiation/quality/duration/timing/severity/associated sxs/prior Treatment) HPI Comments: Patient presents to the emergency department with chief complaint of bladder spasms. She reports a history of interstitial cystitis. She states that she has frequent urinary tract infections, and has associated severe bladder spasms. She believes that she has a urinary tract infection today. She reports some burning with urination. She states that she has had increasingly frequent bladder spasms since yesterday. There are no modifying factors.  Nothing makes the symptoms better or worse.  Additionally, she states that she has had some vaginal bleeding and was diagnosed with abnormal uterine bleeding 3 weeks ago at [facility].  The history is provided by the patient. No language interpreter was used.    Past Medical History  Diagnosis Date  . Tachycardia   . Panic attacks   . SOB (shortness of breath)   . Hyperthyroidism   . Interstitial cystitis   . Interstitial cystitis    Past Surgical History  Procedure Laterality Date  . Breast enhancement surgery    . External ear surgery    . Rhinoplasty     Family History  Problem Relation Age of Onset  . Hypertension Maternal Grandfather   . Cancer Maternal Grandfather     died of non hodgkins lymphoma  . Heart attack Paternal Grandfather   . Cancer Maternal Grandmother     uterine cancer  . Ovarian cysts Mother   . Migraines Mother    Social History  Substance Use Topics  . Smoking status: Never Smoker   . Smokeless tobacco: Never Used  . Alcohol Use: Yes     Comment: moderate social drug   OB History    Gravida Para Term Preterm AB TAB SAB Ectopic Multiple Living   0 0        0     Review of Systems  Constitutional: Negative for fever  and chills.  Respiratory: Negative for shortness of breath.   Cardiovascular: Negative for chest pain.  Gastrointestinal: Negative for nausea, vomiting, diarrhea and constipation.  Genitourinary: Positive for dysuria and vaginal bleeding.  All other systems reviewed and are negative.     Allergies  Benadryl  Home Medications   Prior to Admission medications   Medication Sig Start Date End Date Taking? Authorizing Provider  ALPRAZolam Prudy Feeler(XANAX) 0.25 MG tablet Take 0.25 mg by mouth daily as needed for anxiety.    Historical Provider, MD  amitriptyline (ELAVIL) 25 MG tablet Take 2 tablets (50 mg total) by mouth at bedtime. Slowly titrate up as directed. 01/03/16   Anson FretAntonia B Ahern, MD  aspirin 325 MG tablet Take 650 mg by mouth every 6 (six) hours as needed for mild pain or headache. Reported on 01/03/2016    Historical Provider, MD  Diclofenac Potassium 50 MG PACK Take 50 mg by mouth once as needed. Take once daily as needed with headache onset. Please take with food 01/03/16   Anson FretAntonia B Ahern, MD  ondansetron (ZOFRAN ODT) 4 MG disintegrating tablet Take 1 tablet (4 mg total) by mouth every 8 (eight) hours as needed for nausea or vomiting. 01/03/16   Anson FretAntonia B Ahern, MD  zolpidem (AMBIEN) 10 MG tablet Take 10 mg by mouth at bedtime as needed for sleep.    Historical Provider, MD  BP 125/84 mmHg  Pulse 83  Temp(Src) 98.3 F (36.8 C) (Oral)  Resp 20  Ht  (1.676 m)  Wt 58.968 kg  BMI 20.99 kg/m2  SpO2 100%  LMP 04/02/2016 Physical Exam  Constitutional: She is oriented to person, place, and time. She appears well-developed and well-nourished.  HENT:  Head: Normocephalic and atraumatic.  Eyes: Conjunctivae and EOM are normal. Pupils are equal, round, and reactive to light.  Neck: Normal range of motion. Neck supple.  Cardiovascular: Normal rate and regular rhythm.  Exam reveals no gallop and no friction rub.   No murmur heard. Pulmonary/Chest: Effort normal and breath sounds normal.  No respiratory distress. She has no wheezes. She has no rales. She exhibits no tenderness.  Abdominal: Soft. Bowel sounds are normal. She exhibits no distension and no mass. There is no tenderness. There is no rebound and no guarding.  No focal abdominal tenderness, no RLQ tenderness or pain at McBurney's point, no RUQ tenderness or Murphy's sign, no left-sided abdominal tenderness, no fluid wave, or signs of peritonitis   Musculoskeletal: Normal range of motion. She exhibits no edema or tenderness.  Neurological: She is alert and oriented to person, place, and time.  Skin: Skin is warm and dry.  Psychiatric: She has a normal mood and affect. Her behavior is normal. Judgment and thought content normal.  Nursing note and vitals reviewed.   ED Course  Procedures (including critical care time) Results for orders placed or performed during the hospital encounter of 04/09/16  Urinalysis, Routine w reflex microscopic (not at [facility])  Result Value Ref Range   Color, Urine YELLOW YELLOW   APPearance CLOUDY (A) CLEAR   Specific Gravity, Urine 1.024 1.005 - 1.030   pH 6.0 5.0 - 8.0   Glucose, UA NEGATIVE NEGATIVE mg/dL   Hgb urine dipstick LARGE (A) NEGATIVE   Bilirubin Urine SMALL (A) NEGATIVE   Ketones, ur NEGATIVE NEGATIVE mg/dL   Protein, ur NEGATIVE NEGATIVE mg/dL   Nitrite NEGATIVE NEGATIVE   Leukocytes, UA LARGE (A) NEGATIVE  Pregnancy, urine  Result Value Ref Range   Preg Test, Ur NEGATIVE NEGATIVE  Urine microscopic-add on  Result Value Ref Range   Squamous Epithelial / LPF 6-30 (A) NONE SEEN   WBC, UA TOO NUMEROUS TO COUNT 0 - 5 WBC/hpf   RBC / HPF 6-30 0 - 5 RBC/hpf   Bacteria, UA MANY (A) NONE SEEN   Urine-Other MUCOUS PRESENT    No results found.   I have personally reviewed and evaluated these images and lab results as part of my medical decision-making.    MDM   Final diagnoses:  UTI (lower urinary tract infection)  Bladder spasm    Patient with dysuria and some  bladder spasms. She has a history of interstitial cystitis. Urinalysis is consistent with UTI. Will send urine for culture. Will tip patient Keflex. She is afebrile. She has no focal abdominal or pelvic tenderness. She states that she is still been having some vaginal bleeding since being seen at [facility] 3 weeks ago. She was diagnosed with abnormal uterine bleeding. As there is no focal pelvic tenderness, and the patient is not pregnant, will refer to OB/GYN for further workup. Patient is also instructed to follow-up with her urologist. Patient understands and agrees the plan. She is stable and ready for discharge.    Roxy Horseman, PA-C 04/09/16 1741  Marily Memos, MD 04/10/16 [phone]

## 2016-04-12 LAB — URINE CULTURE: Culture: >=100000 — AB

## 2016-04-13 ENCOUNTER — Telehealth: Payer: Self-pay | Admitting: *Deleted

## 2016-04-13 NOTE — ED Notes (Signed)
Post ED Visit - Positive Culture Follow-up: Successful Patient Follow-Up  Culture assessed and recommendations reviewed by: []  Enzo BiNathan Batchelder, Pharm.D. []  Celedonio MiyamotoJeremy Frens, Pharm.D., BCPS []  Garvin FilaMike Maccia, Pharm.D. []  Georgina PillionElizabeth Martin, Pharm.D., BCPS []  Piedra GordaMinh Pham, [address]Pharm[address]D., BCPS, AAHIVP []  Estella HuskMichelle Turner, Pharm.D., BCPS, AAHIVP [x]  Tennis Mustassie Stewart, Pharm.D. []  Sherle Poeob Vincent, [address]Pharm[address]  Positive urine culture  []  Patient discharged without antimicrobial prescription and treatment is now indicated [x]  Organism is resistant to prescribed ED discharge antimicrobial []  Patient with positive blood cultures  Changes discussed with ED provider: [name], Wendy Richards New antibiotic prescription Fosfomycin 3 gram x 1 dose Called to [facility] [address], [phone]  Contacted patient, date 04/13/2016, time 1339   Wendy Richards, Wendy Richards 04/13/2016, 1:38 PM

## 2016-04-13 NOTE — Progress Notes (Signed)
ED Antimicrobial Stewardship Positive Culture Follow Up   Wendy LevinsMeredith E Richards is an 25 y.o. female who [facility] with a chief complaint of  Chief Complaint  Patient presents with  . Abdominal Pain    Recent Results (from the past 720 hour(s))  Urine culture     Status: Abnormal   Collection Time: 04/09/16  4:40 PM  Result Value Ref Range Status   Specimen Description URINE, CLEAN CATCH  Final   Special Requests NONE  Final   Culture (A)  Final    >=100,000 COLONIES/mL ESCHERICHIA COLI Confirmed Extended Spectrum Beta-Lactamase Producer (ESBL) Performed at [facility]    Report Status 04/12/2016 FINAL  Final   Organism ID, Bacteria ESCHERICHIA COLI (A)  Final      Susceptibility   Escherichia coli - MIC*    AMPICILLIN >=32 RESISTANT Resistant     CEFAZOLIN >=64 RESISTANT Resistant     CEFTRIAXONE 8 RESISTANT Resistant     CIPROFLOXACIN >=4 RESISTANT Resistant     GENTAMICIN <=1 SENSITIVE Sensitive     IMIPENEM <=0.25 SENSITIVE Sensitive     NITROFURANTOIN <=16 SENSITIVE Sensitive     TRIMETH/SULFA >=320 RESISTANT Resistant     AMPICILLIN/SULBACTAM >=32 RESISTANT Resistant     PIP/TAZO 8 SENSITIVE Sensitive     * >=100,000 COLONIES/mL ESCHERICHIA COLI    [x]  Treated with cephalexin, organism resistant to prescribed antimicrobial  New antibiotic prescription: Stop cephalexin. Fosfomycin 3 gm PO x 1.  ED Provider: [name], PA-C  Cassie L. Roseanne RenoStewart, PharmD PGY2 Infectious Diseases Pharmacy Resident Pager: [phone] 04/13/2016 10:27 AM

## 2016-09-06 ENCOUNTER — Encounter (HOSPITAL_BASED_OUTPATIENT_CLINIC_OR_DEPARTMENT_OTHER): Payer: Self-pay | Admitting: Emergency Medicine

## 2016-09-06 ENCOUNTER — Emergency Department (HOSPITAL_BASED_OUTPATIENT_CLINIC_OR_DEPARTMENT_OTHER)
Admission: EM | Admit: 2016-09-06 | Discharge: 2016-09-06 | Disposition: A | Discharge status: Home / Self Care | Payer: BLUE CROSS/BLUE SHIELD | Attending: Emergency Medicine | Admitting: Emergency Medicine

## 2016-09-06 DIAGNOSIS — N3001 Acute cystitis with hematuria: Secondary | ICD-10-CM | POA: Diagnosis not present

## 2016-09-06 DIAGNOSIS — Z7982 Long term (current) use of aspirin: Secondary | ICD-10-CM | POA: Insufficient documentation

## 2016-09-06 DIAGNOSIS — R1030 Lower abdominal pain, unspecified: Secondary | ICD-10-CM | POA: Diagnosis present

## 2016-09-06 LAB — CBC WITH DIFFERENTIAL/PLATELET
Basophils Absolute: 0 10*3/uL (ref 0.0–0.1)
Basophils Relative: 0 %
Eosinophils Absolute: 0 10*3/uL (ref 0.0–0.7)
Eosinophils Relative: 0 %
HCT: 41.4 % (ref 36.0–46.0)
Hemoglobin: 14.2 g/dL (ref 12.0–15.0)
Lymphocytes Relative: 24 %
Lymphs Abs: 2 10*3/uL (ref 0.7–4.0)
MCH: 31.3 pg (ref 26.0–34.0)
MCHC: 34.3 g/dL (ref 30.0–36.0)
MCV: 91.4 fL (ref 78.0–100.0)
Monocytes Absolute: 1.5 10*3/uL — ABNORMAL HIGH (ref 0.1–1.0)
Monocytes Relative: 18 %
Neutro Abs: 4.8 10*3/uL (ref 1.7–7.7)
Neutrophils Relative %: 58 %
Platelets: 302 10*3/uL (ref 150–400)
RBC: 4.53 MIL/uL (ref 3.87–5.11)
RDW: 13 % (ref 11.5–15.5)
WBC: 8.3 10*3/uL (ref 4.0–10.5)

## 2016-09-06 LAB — URINALYSIS, ROUTINE W REFLEX MICROSCOPIC
Bilirubin Urine: NEGATIVE
Glucose, UA: NEGATIVE mg/dL
Ketones, ur: 15 mg/dL — AB
Nitrite: NEGATIVE
Protein, ur: 100 mg/dL — AB
Specific Gravity, Urine: 1.031 — ABNORMAL HIGH (ref 1.005–1.030)
pH: 6 (ref 5.0–8.0)

## 2016-09-06 LAB — COMPREHENSIVE METABOLIC PANEL
ALT: 11 U/L — ABNORMAL LOW (ref 14–54)
AST: 22 U/L (ref 15–41)
Albumin: 4.2 g/dL (ref 3.5–5.0)
Alkaline Phosphatase: 51 U/L (ref 38–126)
Anion gap: 10 (ref 5–15)
BUN: 13 mg/dL (ref 6–20)
CO2: 22 mmol/L (ref 22–32)
Calcium: 9.1 mg/dL (ref 8.9–10.3)
Chloride: 103 mmol/L (ref 101–111)
Creatinine, Ser: 0.96 mg/dL (ref 0.44–1.00)
GFR calc Af Amer: >60 mL/min (ref >60)
GFR calc non Af Amer: >60 mL/min (ref >60)
Glucose, Bld: 91 mg/dL (ref 65–99)
Potassium: 3.5 mmol/L (ref 3.5–5.1)
Sodium: 135 mmol/L (ref 135–145)
Total Bilirubin: 0.5 mg/dL (ref 0.3–1.2)
Total Protein: 7.5 g/dL (ref 6.5–8.1)

## 2016-09-06 LAB — PREGNANCY, URINE: Preg Test, Ur: NEGATIVE

## 2016-09-06 LAB — URINE MICROSCOPIC-ADD ON

## 2016-09-06 LAB — LIPASE, BLOOD: Lipase: 19 U/L (ref 11–51)

## 2016-09-06 MED ORDER — SODIUM CHLORIDE 0.9 % IV BOLUS (SEPSIS)
1000.0000 mL | Freq: Once | INTRAVENOUS | Status: AC
  Administered 2016-09-06: 1000 mL via INTRAVENOUS

## 2016-09-06 MED ORDER — ONDANSETRON HCL 4 MG/2ML IJ SOLN
4.0000 mg | Freq: Once | INTRAMUSCULAR | Status: AC
  Administered 2016-09-06: 4 mg via INTRAVENOUS
  Filled 2016-09-06: qty 2

## 2016-09-06 MED ORDER — NITROFURANTOIN MONOHYD MACRO 100 MG PO CAPS: 100.0000 mg | ORAL_CAPSULE | Freq: Two times a day (BID) | ORAL | 0 refills | Status: DC

## 2016-09-06 MED ORDER — MORPHINE SULFATE (PF) 4 MG/ML IV SOLN
6.0000 mg | Freq: Once | INTRAVENOUS | Status: AC
  Administered 2016-09-06: 6 mg via INTRAVENOUS
  Filled 2016-09-06: qty 2

## 2016-09-06 MED ORDER — ONDANSETRON HCL 4 MG PO TABS: 4.0000 mg | ORAL_TABLET | Freq: Three times a day (TID) | ORAL | 0 refills | Status: DC | PRN

## 2016-09-06 MED ORDER — NITROFURANTOIN MONOHYD MACRO 100 MG PO CAPS
100.0000 mg | ORAL_CAPSULE | Freq: Once | ORAL | Status: AC
  Administered 2016-09-06: 100 mg via ORAL
  Filled 2016-09-06: qty 1

## 2016-09-06 NOTE — ED Triage Notes (Signed)
Pt c/o bladder pain consistent with previous interstitial cystitis x 4 days.

## 2016-09-06 NOTE — ED Notes (Signed)
ED Provider at bedside. 

## 2016-09-06 NOTE — ED Provider Notes (Signed)
[facility] Provider Note   CSN: [id number] Arrival date & time: 09/06/16  0248     History   Chief Complaint Chief Complaint  [id number] presents with  . Cystitis    HPI Wendy Richards is a 25 y.o. female.  The history is provided by the patient and a friend.  Abdominal Pain   This is a recurrent problem. The current episode started more than 2 days ago. The problem occurs constantly. The problem has been gradually worsening. The pain is associated with an unknown factor. The pain is located in the suprapubic region. The pain is severe. Associated symptoms include nausea and hematuria. Pertinent negatives include fever, melena, vomiting and constipation. The symptoms are aggravated by certain positions.    Past Medical History:  Diagnosis Date  . Hyperthyroidism   . Interstitial cystitis   . Interstitial cystitis   . Panic attacks   . SOB (shortness of breath)   . Tachycardia     Patient Active Problem List   Diagnosis Date Noted  . Migraine with aura and without status migrainosus, not intractable 01/06/2016  . Complicated migraine 01/06/2016  . Tachycardia 10/15/2012  . Palpitations 10/15/2012  . Panic anxiety syndrome 10/15/2012    Past Surgical History:  Procedure Laterality Date  . BREAST ENHANCEMENT SURGERY    . EXTERNAL EAR SURGERY    . RHINOPLASTY      OB History    Gravida Para Term Preterm AB Living   0 0       0   SAB TAB Ectopic Multiple Live Births                   Home Medications    Prior to Admission medications   Medication Sig Start Date End Date Taking? Authorizing Provider  ALPRAZolam Prudy Feeler(XANAX) 0.25 MG tablet Take 0.25 mg by mouth daily as needed for anxiety.    Historical Provider, MD  amitriptyline (ELAVIL) 25 MG tablet Take 2 tablets (50 mg total) by mouth at bedtime. Slowly titrate up as directed. 01/03/16   Anson FretAntonia B Ahern, MD  aspirin 325 MG tablet Take 650 mg by mouth every 6 (six) hours as needed for mild pain or  headache. Reported on 01/03/2016    Historical Provider, MD  cephALEXin (KEFLEX) 500 MG capsule Take 1 capsule (500 mg total) by mouth 4 (four) times daily. 04/09/16   Roxy Horsemanobert Browning, PA-C  Diclofenac Potassium 50 MG PACK Take 50 mg by mouth once as needed. Take once daily as needed with headache onset. Please take with food 01/03/16   Anson FretAntonia B Ahern, MD  nitrofurantoin, macrocrystal-monohydrate, (MACROBID) 100 MG capsule Take 1 capsule (100 mg total) by mouth 2 (two) times daily. 09/06/16   Marily MemosJason Saia Derossett, MD  ondansetron (ZOFRAN ODT) 4 MG disintegrating tablet Take 1 tablet (4 mg total) by mouth every 8 (eight) hours as needed for nausea or vomiting. 01/03/16   Anson FretAntonia B Ahern, MD  ondansetron (ZOFRAN) 4 MG tablet Take 1 tablet (4 mg total) by mouth every 8 (eight) hours as needed for nausea or vomiting. 09/06/16   Marily MemosJason Ranay Ketter, MD  zolpidem (AMBIEN) 10 MG tablet Take 10 mg by mouth at bedtime as needed for sleep.    Historical Provider, MD    Family History Family History  Problem Relation Age of Onset  . Ovarian cysts Mother   . Migraines Mother   . Hypertension Maternal Grandfather   . Cancer Maternal Grandfather     died of non  hodgkins lymphoma  . Heart attack Paternal Grandfather   . Cancer Maternal Grandmother     uterine cancer    Social History Social History  Substance Use Topics  . Smoking status: Never Smoker  . Smokeless tobacco: Never Used  . Alcohol use Yes     Comment: moderate social drug     Allergies   Benadryl [diphenhydramine hcl (sleep)]   Review of Systems Review of Systems  Constitutional: Negative for fever.  Gastrointestinal: Positive for abdominal pain and nausea. Negative for constipation, melena and vomiting.  Genitourinary: Positive for hematuria.  All other systems reviewed and are negative.    Physical Exam Updated Vital Signs BP 109/77   Pulse 69   Temp 97.9 F (36.6 C) (Oral)   Resp 16   Ht 5\' 6"  (1.676 m)   Wt 126 lb (57.2 kg)   SpO2  98%   BMI 20.34 kg/m   Physical Exam  Constitutional: She appears well-developed and well-nourished. She appears distressed.  Slightly distressed 2/2 pain, pacing around room  HENT:  Head: Normocephalic and atraumatic.  Eyes: Conjunctivae and EOM are normal.  Neck: Normal range of motion.  Cardiovascular: Normal rate and regular rhythm.   Pulmonary/Chest: Effort normal. No stridor. No respiratory distress. She has no wheezes.  Abdominal: Soft. She exhibits no distension and no mass. There is tenderness (suprapubic). There is no rebound and no guarding.  Neurological: She is alert.  Skin: Skin is warm and dry.  Nursing note and vitals reviewed.    ED Treatments / Results  Labs (all labs ordered are listed, but only abnormal results are displayed) Labs Reviewed  URINALYSIS, ROUTINE W REFLEX MICROSCOPIC (NOT AT [facility]) - Abnormal; Notable for the following:       Result Value   Color, Urine GREEN (*)    APPearance TURBID (*)    Specific Gravity, Urine 1.031 (*)    Hgb urine dipstick LARGE (*)    Ketones, ur 15 (*)    Protein, ur 100 (*)    Leukocytes, UA MODERATE (*)    All other components within normal limits  CBC WITH DIFFERENTIAL/PLATELET - Abnormal; Notable for the following:    Monocytes Absolute 1.5 (*)    All other components within normal limits  COMPREHENSIVE METABOLIC PANEL - Abnormal; Notable for the following:    ALT 11 (*)    All other components within normal limits  URINE MICROSCOPIC-ADD ON - Abnormal; Notable for the following:    Squamous Epithelial / LPF 6-30 (*)    Bacteria, UA MANY (*)    All other components within normal limits  URINE CULTURE  PREGNANCY, URINE  LIPASE, BLOOD    EKG  EKG Interpretation None       Radiology No results found.  Procedures Procedures (including critical care time)  Medications Ordered in ED Medications  sodium chloride 0.9 % bolus 1,000 mL (0 mLs Intravenous Stopped 09/06/16 0404)  morphine 4 MG/ML  injection 6 mg (6 mg Intravenous Given 09/06/16 0329)  ondansetron (ZOFRAN) injection 4 mg (4 mg Intravenous Given 09/06/16 0329)  morphine 4 MG/ML injection 6 mg (6 mg Intravenous Given 09/06/16 0409)  nitrofurantoin (macrocrystal-monohydrate) (MACROBID) capsule 100 mg (100 mg Oral Given 09/06/16 0410)     Initial Impression / Assessment and Plan / ED Course  I have reviewed the triage vital signs and the nursing notes.  Pertinent labs & imaging results that were available during my care of the patient were reviewed by me and considered  in my medical decision making (see chart for details).  Clinical Course   Will treat pain. Screening labs and urine.  Cystitis flare likely 2/2 UTI. Resistant E coli in past, culture added on. Started macrobid (susceptible last time).  FU w/ PCP planned for alter today  Final Clinical Impressions(s) / ED Diagnoses   Final diagnoses:  Acute cystitis with hematuria    New Prescriptions Discharge Medication List as of 09/06/2016  4:32 AM    START taking these medications   Details  nitrofurantoin, macrocrystal-monohydrate, (MACROBID) 100 MG capsule Take 1 capsule (100 mg total) by mouth 2 (two) times daily., Starting Thu 09/06/2016, Print    ondansetron (ZOFRAN) 4 MG tablet Take 1 tablet (4 mg total) by mouth every 8 (eight) hours as needed for nausea or vomiting., Starting Thu 09/06/2016, Print         Marily Memos, MD 09/06/16 [phone]

## 2016-09-06 NOTE — ED Notes (Signed)
Pt provided urine sample and it was delivered to lab. Pt given feminine pad for vaginal bleeding.

## 2016-09-06 NOTE — ED Notes (Signed)
Pt unable to provide urine sample at this time 

## 2016-09-08 LAB — URINE CULTURE: Culture: >=100000 — AB

## 2016-09-09 ENCOUNTER — Telehealth (HOSPITAL_BASED_OUTPATIENT_CLINIC_OR_DEPARTMENT_OTHER): Payer: Self-pay

## 2016-09-09 NOTE — Telephone Encounter (Signed)
Post ED Visit - Positive Culture Follow-up  Culture report reviewed by antimicrobial stewardship pharmacist:  []  Wendy Richards, Pharm.D. []  Wendy Richards, Pharm.D., BCPS []  Wendy Richards, Pharm.D. []  Wendy Richards, Pharm.D., BCPS []  Wendy Richards, [place]arm[place]D., BCPS, AAHIVP []  Wendy Richards, Pharm.D., BCPS, AAHIVP []  Wendy Richards, Pharm.D. []  Wendy Richards, [address] Casilda Carlsaylor Stone Pharm D Positive urine culture Treated with Nitrofurantoin Monohyd Macro, organism sensitive to the same and no further patient follow-up is required at this time.  Wendy Richards, Wendy Richards 09/09/2016, 9:21 AM

## 2017-10-19 IMAGING — CT CT ANGIO HEAD
2 of 8 series · 8 of 33 positions shown · IV contrast (APPLIED)
Comparison: Head CT without contrast 5520 hours today.

CLINICAL DATA: 24-year-old female with severe headache, dysarthria,
right hemi paresis, malaise. Orbital pain. Initial encounter.

EXAM:
CT ANGIOGRAPHY HEAD AND NECK
TECHNIQUE: Multidetector CT imaging of the head and neck was performed using
the standard protocol during bolus administration of intravenous
contrast. Multiplanar CT image reconstructions and MIPs were
obtained to evaluate the vascular anatomy. Carotid stenosis
measurements (when applicable) are obtained utilizing NASCET
criteria, using the distal internal carotid diameter as the
denominator.
CONTRAST:  75mL OMNIPAQUE IOHEXOL 350 MG/ML SOLN

[Series 4: cta head · axial · 0.53mm/px · z∈[-168,+42]mm · 4 of 351 slices shown]
[im 71/351  soft-tissue]
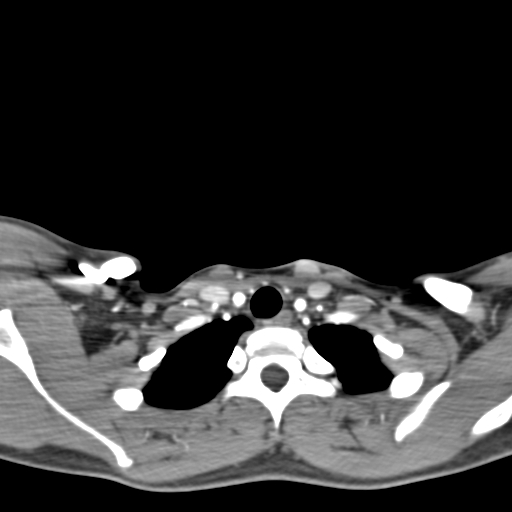
[im 141/351  bone]
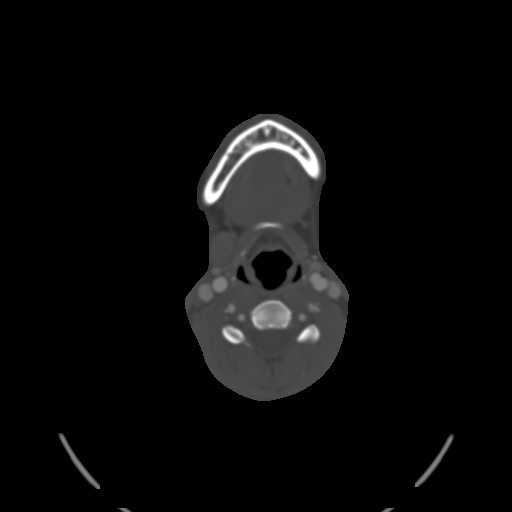
[im 211/351  soft-tissue]
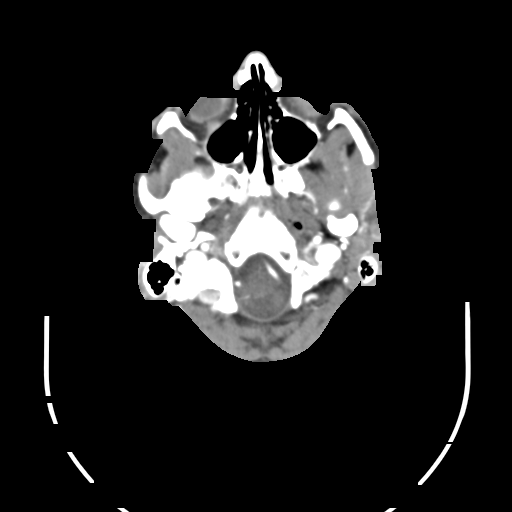
[im 281/351  bone]
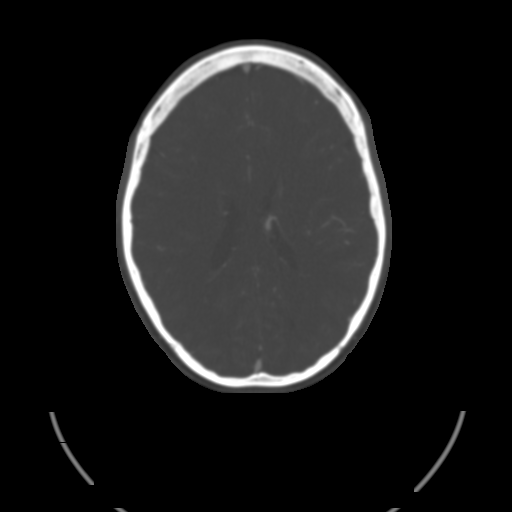

[Series 5: ax thin · axial · 0.48mm/px · z∈[-179,+26]mm · 4 of 344 slices shown]
[im 69/344  soft-tissue]
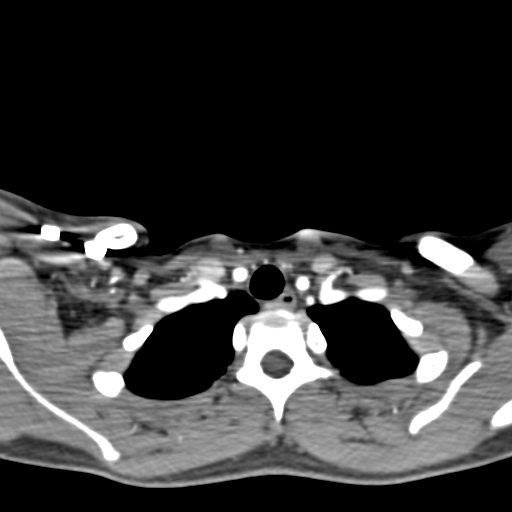
[im 138/344  soft-tissue]
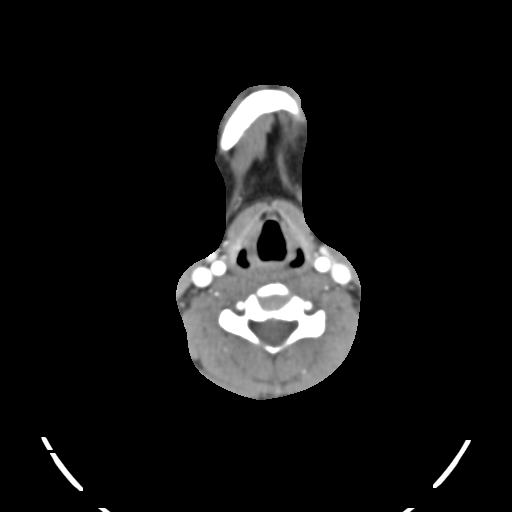
[im 206/344  soft-tissue]
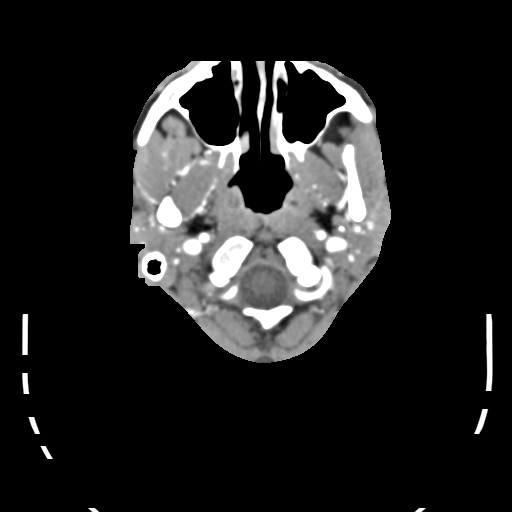
[im 275/344  soft-tissue]
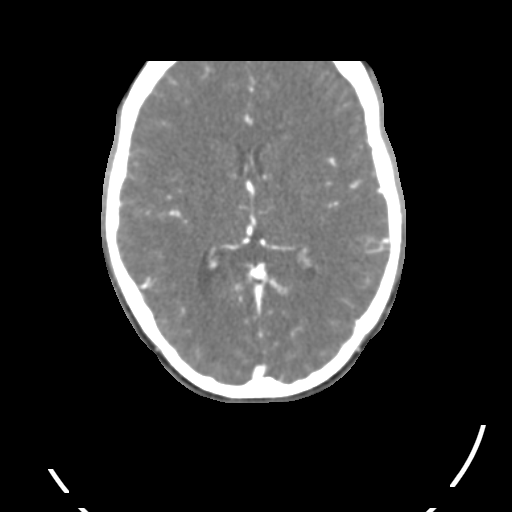

[8 of 33 positions shown; findings below may reference images not displayed]

FINDINGS: CTA NECK

Skeleton: Mild reversal of cervical lordosis. No acute osseous
abnormality identified. Visualized paranasal sinuses and mastoids
are clear.

Other neck: Negative lung apices. No superior mediastinal
lymphadenopathy.

Thyroid, larynx, pharynx, parapharyngeal spaces, retropharyngeal
space, sublingual space, submandibular glands, and parotid glands
are within normal limits. Bilateral cervical lymph nodes are within
normal limits for age.

Aortic arch: 3 vessel arch configuration with no arch
atherosclerosis and normal great vessel origins.

Right carotid system: Negative.

Left carotid system: Negative.

Vertebral arteries:Negative.  Codominant vertebral arteries.

CTA HEAD

Posterior circulation: Normal distal vertebral arteries. Normal PICA
origins. Patent vertebrobasilar junction. Normal AICA origins.
Normal basilar artery. SCA and PCA origins are normal. Posterior
communicating arteries are diminutive or absent. Bilateral PCA
branches are within normal limits.

Anterior circulation: Both ICA siphons appear normal. Normal
ophthalmic artery origins. Normal carotid termini. Normal MCA and
ACA origins. Diminutive or absent anterior communicating artery.
Bilateral ACA branches appear normal. Left MCA M1 segment,
bifurcation, and left MCA branches are within normal limits. Right
MCA M1 segment, bifurcation, and right MCA branches are within
normal limits.

Venous sinuses: Patent.

Anatomic variants: None.

Delayed phase: No abnormal enhancement identified.
IMPRESSION: Negative CTA head and neck. Stable and negative CT appearance of the
brain.

## 2017-10-19 IMAGING — MR MR HEAD W/O CM
10 series · 48 of 48 positions shown · non-contrast
Comparison: CTA head and neck 4656 hours today.

CLINICAL DATA: 24-year-old female with severe headache, dysarthria,
right hemi paresis, malaise, orbital pain. Initial encounter.

EXAM:
MRI HEAD WITHOUT CONTRAST
TECHNIQUE: Multiplanar, multiecho pulse sequences of the brain and surrounding
structures were obtained without intravenous contrast.

[Series 2: GRE · sagittal · 5.0mm · 0.45mm/px · 3 of 23 slices shown]
[im 1/23]
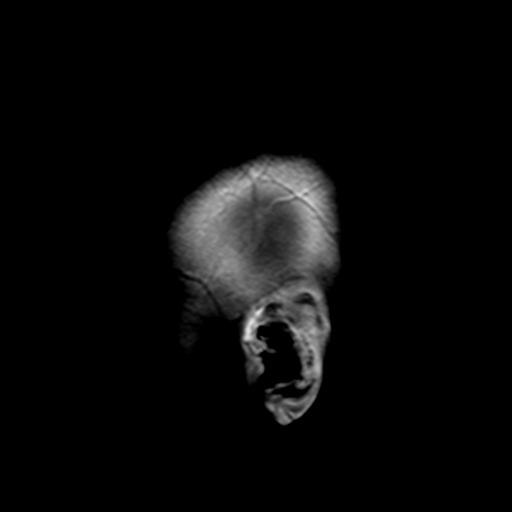
[im 12/23]
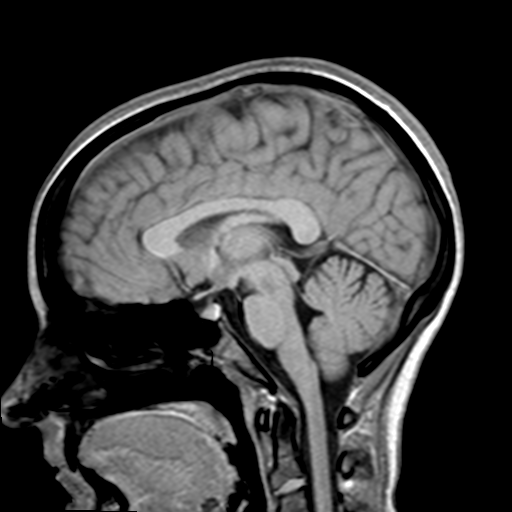
[im 23/23]
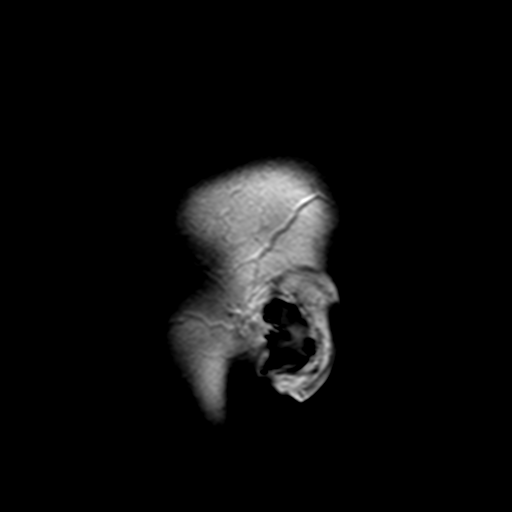

[Series 7: T2 · axial · 5.0mm · 0.45mm/px · z∈[-82,+73]mm · 3 of 25 slices shown (1 of 3)]
[im 1/25]
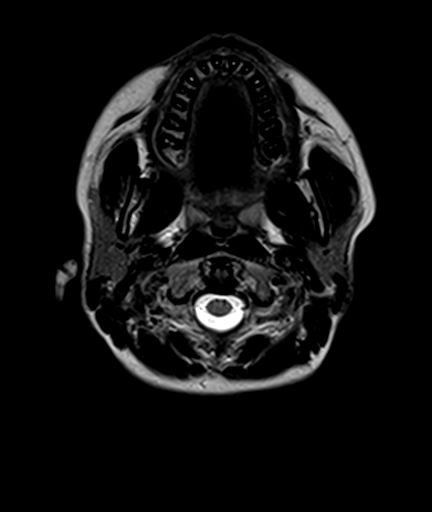
[im 13/25]
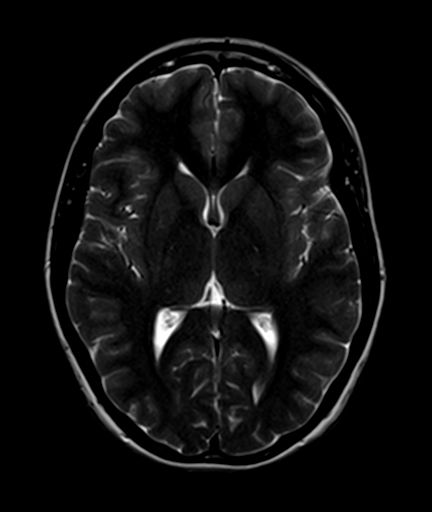
[im 25/25]
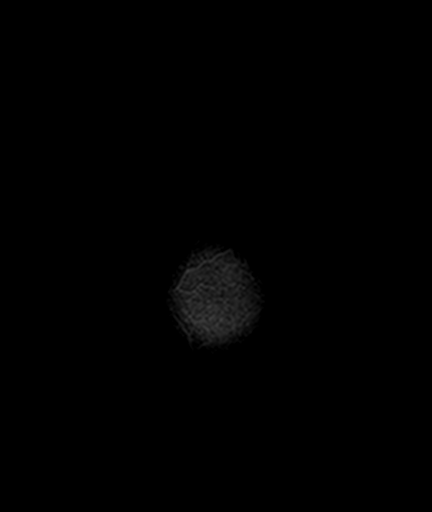

[Series 8: FLAIR · axial · 5.0mm · 0.45mm/px · z∈[-82,+73]mm · 3 of 25 slices shown]
[im 1/25]
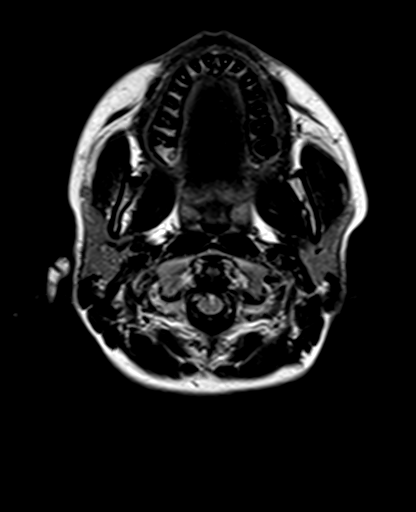
[im 13/25]
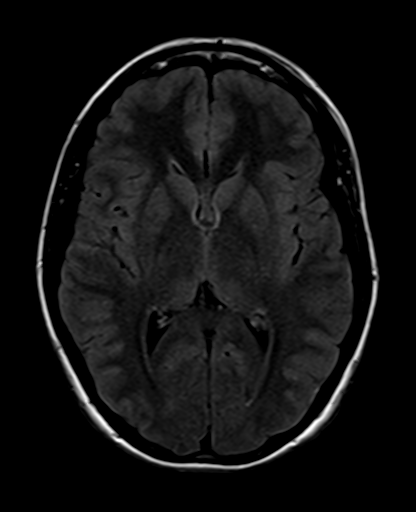
[im 25/25]
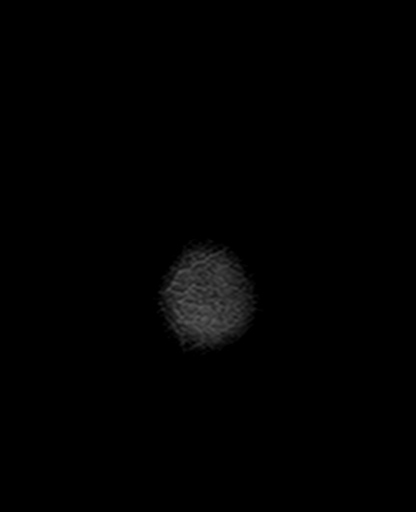

[Series 9: T2 · axial · 5.0mm · 0.45mm/px · z∈[-82,+73]mm · 3 of 25 slices shown (2 of 3)]
[im 1/25]
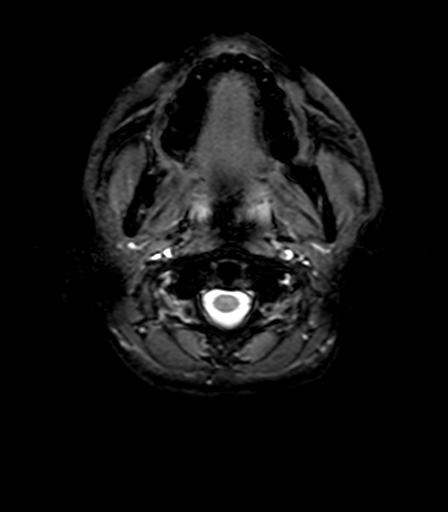
[im 13/25]
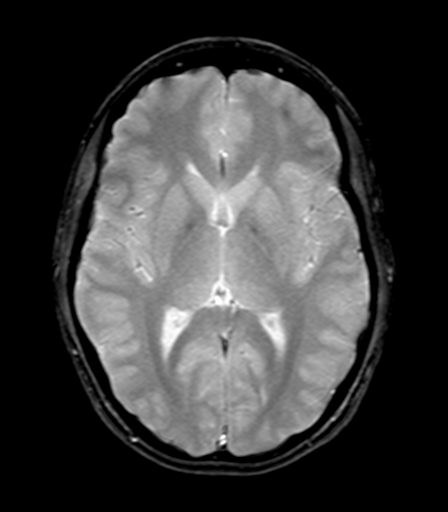
[im 25/25]
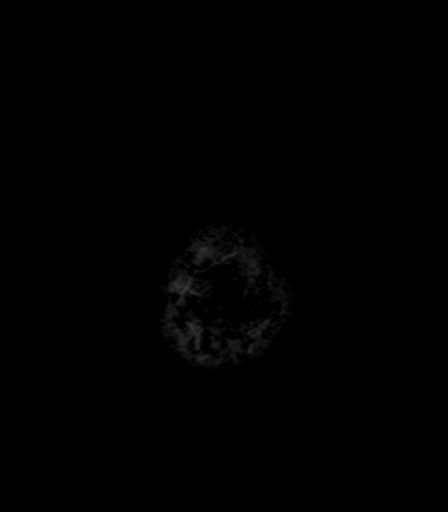

[Series 10: T1 · axial · 3.0mm · 1.00mm/px · z∈[-82,+70]mm · 6 of 52 slices shown]
[im 1/52]
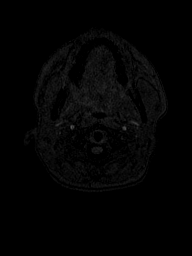
[im 11/52]
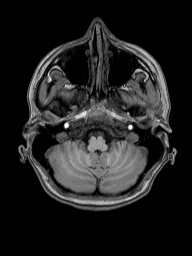
[im 21/52]
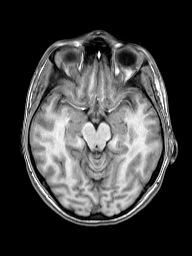
[im 31/52]
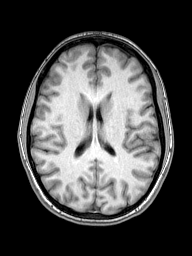
[im 41/52]
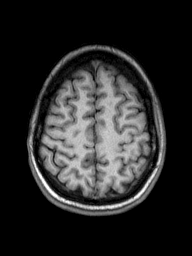
[im 52/52]
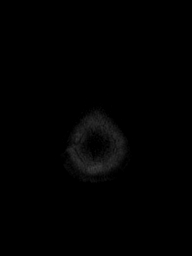

[Series 11: T2 · coronal · 5.0mm · 0.45mm/px · 4 of 30 slices shown (3 of 3)]
[im 1/30]
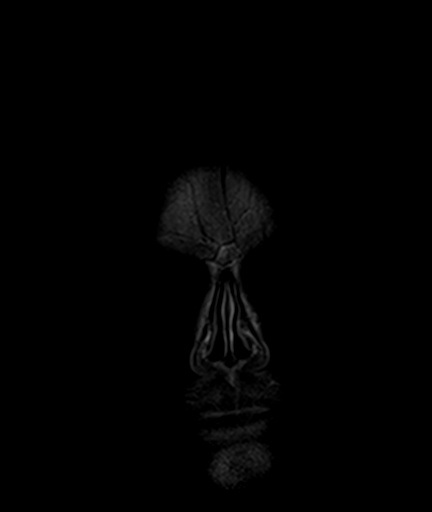
[im 10/30]
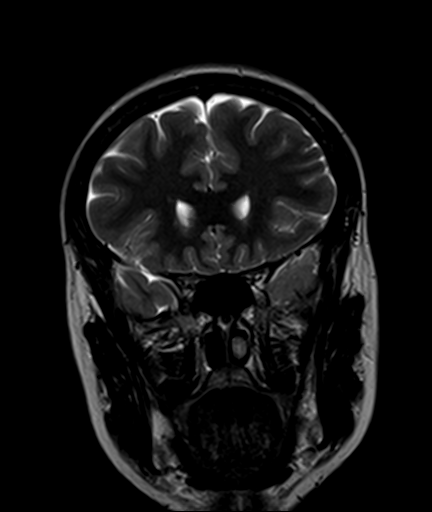
[im 20/30]
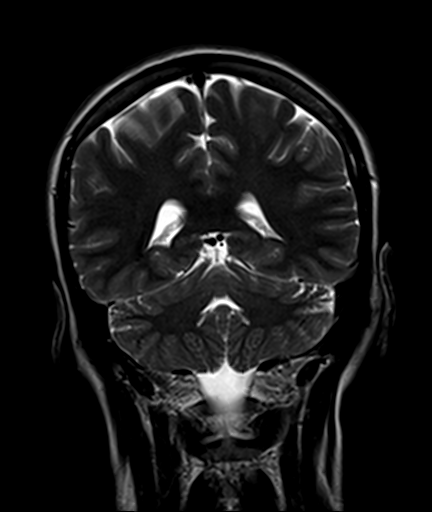
[im 30/30]
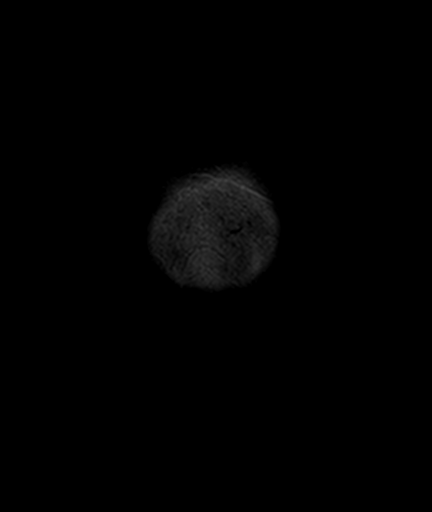

[Series 100: DWI · axial · 3.0mm · 1.80mm/px · z∈[-83,+75]mm · 7 of 54 slices shown (1 of 2)]
[im 1/54]
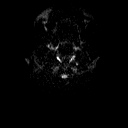
[im 9/54]
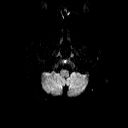
[im 18/54]
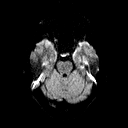
[im 27/54]
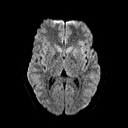
[im 36/54]
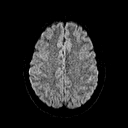
[im 45/54]
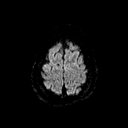
[im 54/54]
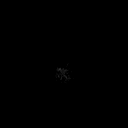

[Series 101: ADC · axial · 3.0mm · 1.80mm/px · z∈[-86,+75]mm · 7 of 55 slices shown (1 of 2)]
[im 1/55]
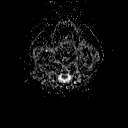
[im 10/55]
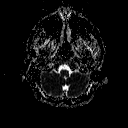
[im 19/55]
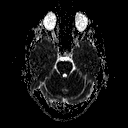
[im 28/55]
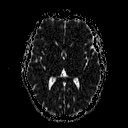
[im 37/55]
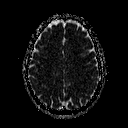
[im 46/55]
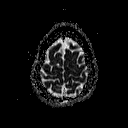
[im 55/55]
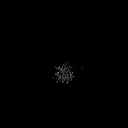

[Series 102: DWI · coronal · 3.0mm · 1.80mm/px · 6 of 49 slices shown (2 of 2)]
[im 1/49]
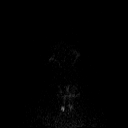
[im 10/49]
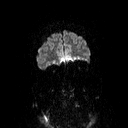
[im 20/49]
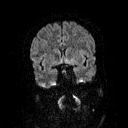
[im 29/49]
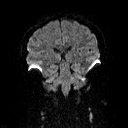
[im 39/49]
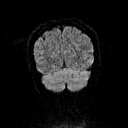
[im 49/49]
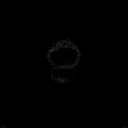

[Series 103: ADC · coronal · 3.0mm · 1.80mm/px · 6 of 49 slices shown (2 of 2)]
[im 1/49]
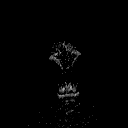
[im 10/49]
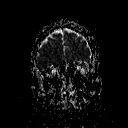
[im 20/49]
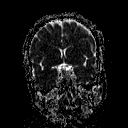
[im 29/49]
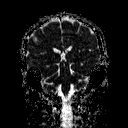
[im 39/49]
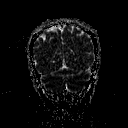
[im 49/49]
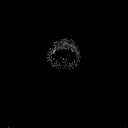

[48 of 48 positions shown; findings below may reference images not displayed]

FINDINGS: No restricted diffusion to suggest acute infarction. No midline
shift, mass effect, evidence of mass lesion, ventriculomegaly,
extra-axial collection or acute intracranial hemorrhage.
Cervicomedullary junction and pituitary are within normal limits.
Negative visualized cervical spine. Major intracranial vascular flow
voids are within normal limits.

Gray and white matter signal is within normal limits throughout the
brain. No encephalomalacia or chronic cerebral blood products.

Visible internal auditory structures appear normal. Mastoids are
clear. Paranasal sinuses are clear. Negative orbit and scalp soft
tissues
IMPRESSION: Normal noncontrast Normal MRI appearance of the brain.

## 2019-06-24 ENCOUNTER — Ambulatory Visit
Admission: EM | Admit: 2019-06-24 | Discharge: 2019-06-24 | Disposition: A | Discharge status: Home / Self Care | Payer: BLUE CROSS/BLUE SHIELD | Attending: Emergency Medicine | Admitting: Emergency Medicine

## 2019-06-24 ENCOUNTER — Other Ambulatory Visit: Payer: Self-pay

## 2019-06-24 DIAGNOSIS — L03011 Cellulitis of right finger: Secondary | ICD-10-CM | POA: Diagnosis not present

## 2019-06-24 DIAGNOSIS — B9689 Other specified bacterial agents as the cause of diseases classified elsewhere: Secondary | ICD-10-CM

## 2019-06-24 MED ORDER — DOXYCYCLINE HYCLATE 100 MG PO CAPS: 100.0000 mg | ORAL_CAPSULE | Freq: Two times a day (BID) | ORAL | 0 refills | Status: AC

## 2019-06-24 MED ORDER — FLUCONAZOLE 200 MG PO TABS: 200.0000 mg | ORAL_TABLET | Freq: Once | ORAL | 0 refills | Status: AC

## 2019-06-24 NOTE — Discharge Instructions (Signed)
Take antibiotic as prescribed, may follow with Diflucan if needed. Return for worsening pain, swelling, numbness in your thumb, fever

## 2019-06-24 NOTE — ED Provider Notes (Signed)
[facility]    CSN: [id number] Arrival date & time: 06/24/19  1540     History   Chief Complaint Chief Complaint  Patient presents with  . Finger Injury    HPI Wendy Richards is a 28 y.o. female presenting for right thumbnail pain, swelling, and redness s/p having nails done on Friday.  Patient has not taken anything for her pain.  Denies immunocompromised state, history of skin infections/boils, fever.   Past Medical History:  Diagnosis Date  . Hyperthyroidism   . Interstitial cystitis   . Interstitial cystitis   . Panic attacks   . SOB (shortness of breath)   . Tachycardia     Patient Active Problem List   Diagnosis Date Noted  . Migraine with aura and without status migrainosus, not intractable 01/06/2016  . Complicated migraine 01/06/2016  . Tachycardia 10/15/2012  . Palpitations 10/15/2012  . Panic anxiety syndrome 10/15/2012    Past Surgical History:  Procedure Laterality Date  . BREAST ENHANCEMENT SURGERY    . EXTERNAL EAR SURGERY    . RHINOPLASTY      OB History    Gravida  0   Para  0   Term      Preterm      AB      Living  0     SAB      TAB      Ectopic      Multiple      Live Births               Home Medications    Prior to Admission medications   Medication Sig Start Date End Date Taking? Authorizing Provider  aspirin 325 MG tablet Take 650 mg by mouth every 6 (six) hours as needed for mild pain or headache. Reported on 01/03/2016    [provider]  doxycycline (VIBRAMYCIN) 100 MG capsule Take 1 capsule (100 mg total) by mouth 2 (two) times daily for 7 days. 06/24/19 07/01/19  Hall-Potvin, [place], PA-C  fluconazole (DIFLUCAN) 200 MG tablet Take 1 tablet (200 mg total) by mouth once for 1 dose. May repeat in 72 hours if needed 06/24/19 06/24/19  Hall-Potvin, [place], PA-C    Family History Family History  Problem Relation Age of Onset  . Ovarian cysts Mother   . Migraines Mother   .  Hypertension Maternal Grandfather   . Cancer Maternal Grandfather        died of non hodgkins lymphoma  . Heart attack Paternal Grandfather   . Cancer Maternal Grandmother        uterine cancer    Social History Social History   Tobacco Use  . Smoking status: Never Smoker  . Smokeless tobacco: Never Used  Substance Use Topics  . Alcohol use: Yes    Comment: moderate social drug  . Drug use: No     Allergies   Benadryl [diphenhydramine hcl (sleep)]   Review of Systems Review of Systems  Constitutional: Negative for fatigue and fever.  Respiratory: Negative for cough and shortness of breath.   Cardiovascular: Negative for chest pain and palpitations.  Musculoskeletal:       Positive for decreased ROM of right thumb Negative for numbness  Skin: Positive for wound. Negative for pallor.  Neurological: Negative for weakness and numbness.     Physical Exam Triage Vital Signs ED Triage Vitals  Enc Vitals Group     BP 06/24/19 1552 123/72     Pulse Rate 06/24/19  1552 74     Resp 06/24/19 1552 18     Temp 06/24/19 1552 98.7 F (37.1 C)     Temp Source 06/24/19 1552 Oral     SpO2 06/24/19 1552 98 %     Weight --      Height --      Head Circumference --      Peak Flow --      Pain Score 06/24/19 1553 8     Pain Loc --      Pain Edu? --      Excl. in [place]? --    No data found.  Updated Vital Signs BP 123/72 (BP Location: Left Arm)   Pulse 74   Temp 98.7 F (37.1 C) (Oral)   Resp 18   LMP 06/10/2019   SpO2 98%   Visual Acuity Right Eye Distance:   Left Eye Distance:   Bilateral Distance:    Right Eye Near:   Left Eye Near:    Bilateral Near:     Physical Exam Constitutional:      General: She is not in acute distress. HENT:     Head: Normocephalic and atraumatic.  Eyes:     General: No scleral icterus.    Pupils: Pupils are equal, round, and reactive to light.  Cardiovascular:     Rate and Rhythm: Normal rate.  Pulmonary:     Effort: Pulmonary  effort is normal.  Musculoskeletal: Normal range of motion.        General: Swelling and tenderness present.     Comments: Right thumb decreased ROM & strength second to pain/swelling.  NVI  Skin:    Capillary Refill: Capillary refill takes less than 2 seconds.     Coloration: Skin is not jaundiced or pale.     Comments: Right thumb erythematous, warm around base of medial nail bed.  Neurological:     Mental Status: She is alert and oriented to person, place, and time.      UC Treatments / Results  Labs (all labs ordered are listed, but only abnormal results are displayed) Labs Reviewed - No data to display  EKG   Radiology No results found.  Procedures Incision and Drainage  Date/Time: 06/24/2019 6:33 PM Performed by: Quincy Sheehan, PA-C Authorized by: Quincy Sheehan, PA-C   Consent:    Consent obtained:  Verbal   Consent given by:  Patient   Risks discussed:  Bleeding, incomplete drainage, pain and damage to other organs   Alternatives discussed:  No treatment Universal protocol:    Patient identity confirmed:  Verbally with patient Location:    Indications for incision and drainage: paronychia.   Location:  Upper extremity   Upper extremity location:  Finger   Finger location:  R thumb Pre-procedure details:    Procedure prep: alcohol prep pad. Anesthesia (see MAR for exact dosages):    Anesthesia method:  Nerve block   Block location:  Right thumb   Block needle gauge:  25 G   Block anesthetic:  Lidocaine 2% w/o epi   Block technique:  Digital   Block injection procedure:  Anatomic landmarks identified, anatomic landmarks palpated, negative aspiration for blood, introduced needle and incremental injection   Block outcome:  Anesthesia achieved Procedure type:    Complexity:  Simple Procedure details:    Needle aspiration: yes     Needle size:  18 G   Incision types:  Stab incision   Incision depth:  Subcutaneous   Drainage:  Purulent and  bloody   Drainage amount:  Moderate   Wound treatment:  Wound left open   Packing materials:  None Post-procedure details:    Patient tolerance of procedure:  Tolerated well, no immediate complications   (including critical care time)  Medications Ordered in UC Medications - No data to display  Initial Impression / Assessment and Plan / UC Course  I have reviewed the triage vital signs and the nursing notes.  Pertinent labs & imaging results that were available during my care of the patient were reviewed by me and considered in my medical decision making (see chart for details).     1. Paronychia of right thumb Successful I&D with 18G needle done in office; pt tolerated well.  Due to limitations of exam second to artificial nail/risk of recurrence in thereof, will start doxycycline.  Diflucan for pt reported yeast infections s/p abx use.  Return precautions discussed, patient verbalized understanding and is agreeable to plan. Final Clinical Impressions(s) / UC Diagnoses   Final diagnoses:  Paronychia of finger of right hand     Discharge Instructions     Take antibiotic as prescribed, may follow with Diflucan if needed. Return for worsening pain, swelling, numbness in your thumb, fever    ED Prescriptions    Medication Sig Dispense Auth. Provider   doxycycline (VIBRAMYCIN) 100 MG capsule Take 1 capsule (100 mg total) by mouth 2 (two) times daily for 7 days. 14 capsule Hall-Potvin, [place], PA-C   fluconazole (DIFLUCAN) 200 MG tablet Take 1 tablet (200 mg total) by mouth once for 1 dose. May repeat in 72 hours if needed 2 tablet Hall-Potvin, [place], PA-C     Controlled Substance Prescriptions [place] Controlled Substance Registry consulted? Not Applicable   Shea EvansHall-Potvin, Brittany, [place] JerseyPA-C 06/24/19 1837

## 2019-06-24 NOTE — ED Triage Notes (Signed)
Pt c/o pain to lt thump pain at her fake finger nail since friday. Redness and heat noted to thumb nail area.

## 2019-12-09 ENCOUNTER — Encounter: Payer: Self-pay | Admitting: Physical Medicine and Rehabilitation

## 2019-12-09 ENCOUNTER — Ambulatory Visit (INDEPENDENT_AMBULATORY_CARE_PROVIDER_SITE_OTHER): Payer: 59 | Admitting: Physical Medicine and Rehabilitation

## 2019-12-09 ENCOUNTER — Other Ambulatory Visit: Payer: Self-pay

## 2019-12-09 VITALS — BP 140/80 | HR 128

## 2019-12-09 DIAGNOSIS — M542 Cervicalgia: Secondary | ICD-10-CM | POA: Diagnosis not present

## 2019-12-09 DIAGNOSIS — G43109 Migraine with aura, not intractable, without status migrainosus: Secondary | ICD-10-CM

## 2019-12-09 DIAGNOSIS — M7918 Myalgia, other site: Secondary | ICD-10-CM

## 2019-12-09 MED ORDER — KETOROLAC TROMETHAMINE 30 MG/ML IJ SOLN
15.0000 mg | INTRAMUSCULAR | Status: AC | PRN
  Administered 2019-12-09: 13:00:00 15 mg via INTRA_ARTICULAR

## 2019-12-09 MED ORDER — LIDOCAINE HCL 1 % IJ SOLN
3.0000 mL | INTRAMUSCULAR | Status: AC | PRN
Start: 2019-12-09 — End: 2019-12-09
  Administered 2019-12-09: 13:00:00 3 mL

## 2019-12-09 MED ORDER — LIDOCAINE HCL 1 % IJ SOLN
3.0000 mL | INTRAMUSCULAR | Status: AC | PRN
  Administered 2019-12-09: 13:00:00 3 mL

## 2019-12-09 NOTE — Progress Notes (Signed)
Wendy Richards - 29 y.o. female MRN [id number]  Date of birth: October 20, 1991  Office Visit Note: Visit Date: 12/09/2019 PCP: Richardean Chimera, MD Referred by: Richardean Chimera, MD  Subjective: Chief Complaint  Patient presents with  . Neck - Pain  . Head - Pain   HPI: Wendy Richards is a 29 y.o. female who comes in today At the request of [facility] for evaluation and management of chronic worsening severe lower cervical neck pain with referral pattern to the occiput with history of headache.  She reports progressive worsening over the last year of a lot of neck pain that is axial some referral into the upper trapezius bilaterally.  Worse with forward flexion get some relief with extension.  She reports that she looks down long enough she gets some vertigo.  She has some pain with head rotation and sleeping at night is difficult.  She has had some improvement lately with icing a sleep number bed that she can adjust.  She has a fairly long history of multiple pain complaints including pelvic pain and history of migraine and TMJ.  She endorses a history in 2017 of a complicated migraine with facial droop.  She refers to this as Bell's palsy.  There are notes from 2017 in February from Dr. Naomie Dean that I did review.  Patient did have an EEG.  She also had brain MRI that was normal.  She was started on some medication including amitriptyline which was not very helpful.  She has been on amitriptyline in the past.  Her case is complicated by panic anxiety syndrome.  She has been working at [employer] and has had treatments there.  She reports adjustments have helped in the past but some of the more manual treatments of actually flared up the pain.  She has tried some new treatments from them and it helped temporarily.  She has had in the past acupuncture as well as reflexology.  She likes to try alternative treatments.  She reports to me today that she was talking with someone with Dr.  Toni Amend office when they used to share offices about her pain complaints and they suggested seeing me since Dr. Nickola Major does not accept her insurance.  She also had a period where she did not have insurance and could not follow-up with Dr. Lucia Gaskins.  She denies any radicular symptoms down the arms.  No numbness tingling or paresthesia.  No focal weakness.  She does get some twitching in the feet recently which is new.  She reports this really seems to bother her quite a bit.  She rates her pain as a 7 out of 10 but it affects her daily living to a high degree.  During the coronavirus issues she has not been able to work out and she reports losing muscle mass and physical fitness.  She was working out quite a bit and this seemed to help a great deal and she is just not been able to do that.  Recently she tried to workout it seemed to flare the symptoms up.  She did bring in today notes of a digital motion test done on her cervical spine as well as plain film x-rays AP and lateral completed in June of last year.  There is also an atlantoaxial view as well.  These were reviewed with the patient and reviewed below.  I did tell her that I really have no experience with a digital motion pictures that were present of and  for me that would be of limited help in general.  She has not had specific trigger point injection or dry needling.  She has had Botox evidently for headache but this was done by a practitioner that was not a neurologist.  Review of Systems  Constitutional: Negative for chills, fever, malaise/fatigue and weight loss.  HENT: Negative for hearing loss and sinus pain.   Eyes: Negative for blurred vision, double vision and photophobia.  Respiratory: Negative for cough and shortness of breath.   Cardiovascular: Negative for chest pain, palpitations and leg swelling.  Gastrointestinal: Negative for abdominal pain, nausea and vomiting.  Genitourinary: Negative for flank pain.    Musculoskeletal: Positive for joint pain and neck pain. Negative for myalgias.  Skin: Negative for itching and rash.  Neurological: Positive for headaches. Negative for tremors, focal weakness and weakness.  Endo/Heme/Allergies: Negative.   Psychiatric/Behavioral: Negative for depression.  All other systems reviewed and are negative.  Otherwise per HPI.  Assessment & Plan: Visit Diagnoses:  1. Migraine with aura and without status migrainosus, not intractable   2. Complicated migraine   3. Cervicalgia   4. Myofascial pain syndrome     Plan: Findings:  Chronic worsening severe neck pain which I feel like is multifactorial and mostly a combination of myofascial trigger point pain as well as some pain coming from the reversal of the lordosis at C4 with some early degenerative type change although no real significant facet arthropathy.  I think this may be a trigger to upper cervical problems and occipital type headache and onset of migraine headache.  I think for sure I want to get her back involved with Dr. Naomie Dean and referral will be place  We also decided to complete trigger point injection today bilaterally and musculature probably identified as the levator scapular trapezius at the superior angle of the scapula bilaterally.  Depending on relief for that would look at regrouping with a physical therapist very short-term from a dry needling course.  Also depending on where she is at in terms of her pain and if it changes I would look at MRI of the cervical spine.  We also discussed strengthening exercises and stretching.  I think she should try to get back into working out at the gym.  I do not think there is any structural evidence that would be a problem.  I would not have her into a lot of pain but I think if she can break the cycle and get through some exercise I think in general this would help her.  She has no history or diagnosis of fibromyalgia.    Meds & Orders: No orders of the  defined types were placed in this encounter.   Orders Placed This Encounter  Procedures  . Trigger Point Inj  . Ambulatory referral to Neurology    Follow-up: Return if symptoms worsen or fail to improve.   Procedures: Trigger Point Inj  Date/Time: 12/09/2019 12:44 PM Performed by: Tyrell Antonio, MD Authorized by: Tyrell Antonio, MD   Consent Given by:  Patient Site marked: the procedure site was marked   Timeout: prior to procedure the correct patient, procedure, and site was verified   Total # of Trigger Points:  3 or more Location: neck and back   Needle Size:  25 G Approach:  Dorsal Medications #1:  15 mg ketorolac 30 MG/ML; 3 mL lidocaine 1 % Medications #2:  3 mL lidocaine 1 % Additional Injections?: No   Patient tolerance:  Patient  tolerated the procedure well with no immediate complications Comments: Trigger points palpated and injected in the levator scapula and trapezius bilaterally.    No notes on file   Clinical History: Plain film cervical x-ray AP and lateral dated June 2020 from [place] demonstrate fairly normal AP anatomic alignment with maybe a little bit of shift rightward in the mid cervical region but without really facet arthropathy or uncovertebral joint hypertrophy.  Lateral film shows reversal of normal cervical lordosis particularly around C3 and 4.  Disc heights are pretty well-maintained and there is no listhesis.   She reports that she has never smoked. She has never used smokeless tobacco. No results for input(s): HGBA1C, LABURIC in the last 8760 hours.  Objective:  VS:  HT:    WT:   BMI:     BP:140/80  HR:(!) 128bpm  TEMP: ( )  RESP:  Physical Exam Vitals and nursing note reviewed.  Constitutional:      General: She is not in acute distress.    Appearance: Normal appearance. She is well-developed.  HENT:     Head: Normocephalic and atraumatic.     Nose: Nose normal.     Mouth/Throat:     Mouth: Mucous membranes are moist.      Pharynx: Oropharynx is clear.  Eyes:     Conjunctiva/sclera: Conjunctivae normal.     Pupils: Pupils are equal, round, and reactive to light.  Cardiovascular:     Rate and Rhythm: Regular rhythm.  Pulmonary:     Effort: Pulmonary effort is normal. No respiratory distress.  Abdominal:     General: There is no distension.     Palpations: Abdomen is soft.     Tenderness: There is no guarding.  Musculoskeletal:     Cervical back: Normal range of motion and neck supple. Tenderness present. No rigidity.     Right lower leg: No edema.     Left lower leg: No edema.     Comments: Examination of cervical spine shows the patient sits with forward flexed cervical spine with somewhat of a chin tuck.  She has focal trigger points particularly in the left more than right levator scapula and trapezius.  She also has trigger points in the rhomboids and supraspinatus bilaterally.  She also has taut bands in the upper cervical paraspinal musculature.  She has good range of motion no pain at end ranges.  More pain with forward flexion than extension.  She has no shoulder impingement with good upper extremity strength bilaterally.  Skin:    General: Skin is warm and dry.     Findings: No erythema or rash.  Neurological:     General: No focal deficit present.     Mental Status: She is alert and oriented to person, place, and time.     Sensory: No sensory deficit.     Motor: No weakness or abnormal muscle tone.     Coordination: Coordination normal.     Gait: Gait normal.  Psychiatric:        Mood and Affect: Mood normal.        Behavior: Behavior normal.        Thought Content: Thought content normal.     Ortho Exam Imaging: No results found.  Past Medical/Family/Surgical/Social History: Medications & Allergies reviewed per EMR, new medications updated. Patient Active Problem List   Diagnosis Date Noted  . Migraine with aura and without status migrainosus, not intractable 01/06/2016  .  Complicated migraine 04/28/7627  . Tachycardia  10/15/2012  . Palpitations 10/15/2012  . Panic anxiety syndrome 10/15/2012   Past Medical History:  Diagnosis Date  . Hyperthyroidism   . Interstitial cystitis   . Interstitial cystitis   . Panic attacks   . SOB (shortness of breath)   . Tachycardia    Family History  Problem Relation Age of Onset  . Ovarian cysts Mother   . Migraines Mother   . Hypertension Maternal Grandfather   . Cancer Maternal Grandfather        died of non hodgkins lymphoma  . Heart attack Paternal Grandfather   . Cancer Maternal Grandmother        uterine cancer   Past Surgical History:  Procedure Laterality Date  . BREAST ENHANCEMENT SURGERY    . EXTERNAL EAR SURGERY    . RHINOPLASTY     Social History   Occupational History  . Occupation: Geophysical data processor  Tobacco Use  . Smoking status: Never Smoker  . Smokeless tobacco: Never Used  Substance and Sexual Activity  . Alcohol use: Yes    Comment: moderate social drug  . Drug use: No  . Sexual activity: Yes    Birth control/protection: Pill

## 2019-12-09 NOTE — Progress Notes (Signed)
  Wendy Richards 12/2016 daily occipital headaches and migraines with aura and right-sided facial droop and sensory loss, insomnia.  Numeric Pain Rating Scale and Functional Assessment Average Pain (7) Pain Right Now (7)    In the last MONTH (on 0-10 scale) has pain interfered with the following?  1. General activity like being  able to carry out your everyday physical activities such as walking, climbing stairs, carrying groceries, or moving a chair?  Rating(10)  2. Relation with others like being able to carry out your usual social activities and roles such as  activities at home, at work and in your community. Rating(10)  3. Enjoyment of life such that you have  been bothered by emotional problems such as feeling anxious, depressed or irritable?  Rating(10)

## 2019-12-14 ENCOUNTER — Encounter: Payer: Self-pay | Admitting: Physical Medicine and Rehabilitation

## 2019-12-15 ENCOUNTER — Other Ambulatory Visit: Payer: Self-pay | Admitting: Physical Medicine and Rehabilitation

## 2019-12-15 DIAGNOSIS — M542 Cervicalgia: Secondary | ICD-10-CM

## 2019-12-15 DIAGNOSIS — M7918 Myalgia, other site: Secondary | ICD-10-CM

## 2019-12-15 NOTE — Progress Notes (Signed)
Referral order placed to physical therapy for cervical and upper back trigger point injections/dry needling.  Can evaluate and treat appropriately.

## 2019-12-25 ENCOUNTER — Encounter: Payer: Self-pay | Admitting: Rehabilitative and Restorative Service Providers"

## 2019-12-25 ENCOUNTER — Ambulatory Visit (INDEPENDENT_AMBULATORY_CARE_PROVIDER_SITE_OTHER): Payer: 59 | Admitting: Rehabilitative and Restorative Service Providers"

## 2019-12-25 ENCOUNTER — Other Ambulatory Visit: Payer: Self-pay

## 2019-12-25 DIAGNOSIS — M6281 Muscle weakness (generalized): Secondary | ICD-10-CM | POA: Diagnosis not present

## 2019-12-25 DIAGNOSIS — R293 Abnormal posture: Secondary | ICD-10-CM | POA: Diagnosis not present

## 2019-12-25 DIAGNOSIS — M542 Cervicalgia: Secondary | ICD-10-CM

## 2019-12-25 NOTE — Therapy (Addendum)
[facility] [address] [place], [place], [address] Phone: [phone]   Fax:  [phone]  Physical Therapy Evaluation  Patient Details  Name: Wendy Richards MRN: [id number] Date of Birth: [birth year] Referring Provider (PT): Dr. Ernestina Patches   Encounter Date: 12/25/2019  PT End of Session - 12/25/19 1415    Visit Number  1    Number of Visits  12    Date for PT Re-Evaluation  02/05/20    PT Start Time  0117p    PT Stop Time  0157p    PT Time Calculation (min)  40 min    Activity Tolerance  Patient tolerated treatment well    Behavior During Therapy  [facility] for tasks assessed/performed       Past Medical History:  Diagnosis Date  . Hyperthyroidism   . Interstitial cystitis   . Interstitial cystitis   . Panic attacks   . SOB (shortness of breath)   . Tachycardia     Past Surgical History:  Procedure Laterality Date  . BREAST ENHANCEMENT SURGERY    . EXTERNAL EAR SURGERY    . RHINOPLASTY      There were no vitals filed for this visit.   Subjective Assessment - 12/25/19 1320    Subjective  Pt. stated more long term complaints of pain over last year or so. 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 Pt. saw Dr. Ernestina Patches recently for complaints of pain.  Pt. stated history of manual adjustments with no long term relief.  Pt. indicated stated symptoms stay fairly consistent.  Pt. stated unable to perform usual workout activity due to pain symptoms.  Pt. indicated having some relief c light movement and cervical extension.    Diagnostic tests  xray    Patient Stated Goals  Return to activity and reduce pain.    Currently in Pain?  Yes    Pain Score  5    Pain at worst 10/10.  Pain at best 3/10   Pain Location  Neck    Pain Descriptors / Indicators  Constant;Tightness;Dull;Aching    Pain Type  Chronic pain    Pain Radiating Towards  Denies UE symptoms bilaterally.    Pain Onset  More than a month ago    Pain  Frequency  Constant    Aggravating Factors   Patient specific functional scale reporting : Prolonged computer work( 3/10) , looking down while sitting (3/10), lifting/carrying (4/10), workouts (0/10).  Mild complaints c sleeping (8/10).    Pain Relieving Factors  gentle neck rolls    Effect of Pain on Daily Activities  Limits work, workout activity.         [facility] PT Assessment - 12/25/19 0001      Assessment   Medical Diagnosis  Cervicalgia, myofascial pain syndrome    Referring Provider (PT)  Dr. Ernestina Patches    Onset Date/Surgical Date  11/05/18    Hand Dominance  Right      Precautions   Precautions  None      Restrictions   Weight Bearing Restrictions  No      Balance Screen   Has the patient fallen in the past 6 months  No    Has the patient had a decrease in activity level because of a fear of falling?   No    Is the patient reluctant to leave their home because of a fear of falling?   No      Home Environment  Living Environment  Private residence    [place]  Multi-level      Prior Function   Level of [place]  Other (comment)   Works at [employer] office   Leisure  [employer]      Cognition   Overall Cognitive Status  Within Functional Limits for tasks assessed      Sensation   Light Touch  Appears Intact      Posture/Postural Control   Posture/Postural Control  Postural limitations    Postural Limitations  Forward head;Rounded Shoulders      ROM / Strength   AROM / PROM / Strength  AROM;PROM;Strength      AROM   Overall AROM Comments  Supine cervical passive downslope mobility WFL at this time    AROM Assessment Site  Cervical    Cervical Flexion  42   pain in lower and mid cervical centrally   Cervical Extension  50    Cervical - Right Rotation  80    Cervical - Left Rotation  72   Concordant pain     Strength   Overall Strength Comments  Poor deep neck flexor endurance in supine     Strength Assessment Site  Shoulder;Elbow    Right/Left Shoulder  Left;Right    Right Shoulder Flexion  4+/5    Right Shoulder ABduction  4+/5    Right Shoulder Internal Rotation  5/5    Right Shoulder External Rotation  5/5    Left Shoulder Flexion  5/5    Left Shoulder ABduction  5/5    Left Shoulder Internal Rotation  5/5    Left Shoulder External Rotation  5/5    Right/Left Elbow  Left;Right      Palpation   Palpation comment  Tenderness, Trigger Points noted in Bilateral upper trap, cervical paraspinals.      Special Tests    Special Tests  Cervical    Cervical Tests  Dictraction      Distraction Test   Findngs  Positive    Comment  Reduced symptoms in cervical spine                Objective measurements completed on examination: See above findings.      [facility] Adult PT Treatment/Exercise - 12/25/19 0001      Exercises   Exercises  Neck;Shoulder      Neck Exercises: Seated   Other Seated Exercise  seated BUE ER, scapular retraction 2 second hold 20 x      Neck Exercises: Supine   Other Supine Exercise  Supine Upper Cervical flexion hold 5 seconds x 15      Shoulder Exercises: Supine   Horizontal ABduction  Strengthening;Both;Other (comment)   3 x 10 green band            PT Education - 12/25/19 1316    Education Details  HEP, POC    Person(s) Educated  Patient    Methods  Explanation;Verbal cues    Comprehension  Verbalized understanding       PT Short Term Goals - 12/25/19 1420      PT SHORT TERM GOAL #1   Title  Patient will demonstrate independent use of home exercise program to maintain progress from in clinic treatments.    Time  2    Period  Weeks    Status  New    Target Date  01/08/20  PT Long Term Goals - 12/25/19 1421      PT LONG TERM GOAL #1   Title  Patient will demonstrate/report pain at worst less than or equal to 2/10 to facilitate minimal limitation in daily activity secondary to pain symptoms.    Time  6     Period  Weeks    Status  New    Target Date  02/05/20      PT LONG TERM GOAL #2   Title  Patient will demonstrate independent use of home exercise program to facilitate ability to maintain/progress functional gains from skilled physical therapy services.    Time  6    Period  Weeks    Status  New    Target Date  02/05/20      PT LONG TERM GOAL #3   Title  Patient will demonstrate return to work/recreational activity at previous level of function without limitations secondary due to condition    Time  6    Period  Weeks    Status  New    Target Date  02/05/20      PT LONG TERM GOAL #4   Title  Patient will demonstrate cervical AROM WFL s symptoms to facilitate daily activity including driving, self care at PLOF s limitation due to symptoms.    Time  6    Period  Weeks    Status  New    Target Date  02/05/20             Plan - 12/25/19 1422    Clinical Impression Statement  Patient is a 29 y.o. feamle who comes to clinic with complaints of cervical pain c headache with postural/movement coordination deficits that impair her ability to perform usual daily and recreational functional activities without increase difficulty/symptoms at this time.  Patient to benefit from skilled PT services to address impairments and limitations to improve to previous level of function without restriction secondary to condition.    Examination-Activity Limitations  Carry;Sit;Lift;Reach Overhead    Examination-Participation Restrictions  Other   Recreational activity, work   Stability/Clinical Decision Making  Stable/Uncomplicated    Clinical Decision Making  Low    Rehab Potential  Good    PT Frequency  2x / week    PT Duration  6 weeks    PT Treatment/Interventions  ADLs/Self Care Home Management;Electrical Stimulation;Ultrasound;Traction;Moist Heat;Iontophoresis 36m/ml Dexamethasone;Functional mobility training;Therapeutic activities;Therapeutic exercise;Neuromuscular re-education;Manual  techniques;Patient/family education;Passive range of motion;Dry needling;Joint Manipulations;Spinal Manipulations;Taping    PT Next Visit Plan  Review HEP, possible trigger point manual release techniques, traction prn    PT Home Exercise Plan  [email or web address]    Consulted and Agree with Plan of Care  Patient       Patient will benefit from skilled therapeutic intervention in order to improve the following deficits and impairments:  Decreased range of motion, Decreased endurance, Impaired UE functional use, Decreased activity tolerance, Pain, Impaired perceived functional ability, Postural dysfunction, Decreased strength, Decreased mobility  Visit Diagnosis: Cervicalgia - Plan: PT plan of care cert/re-cert  Abnormal posture - Plan: PT plan of care cert/re-cert  Muscle weakness (generalized) - Plan: PT plan of care cert/re-cert     Problem List Patient Active Problem List   Diagnosis Date Noted  . Migraine with aura and without status migrainosus, not intractable 01/06/2016  . Complicated migraine 009/32/6712 . Tachycardia 10/15/2012  . Palpitations 10/15/2012  . Panic anxiety syndrome 10/15/2012    MScot Jun PT, DPT, OCS, ATC 12/29/19  3:44 PM  PHYSICAL THERAPY DISCHARGE SUMMARY  Visits from Start of Care:1  Current functional level related to goals / functional outcomes: eval only   Remaining deficits: As documented   Education / Equipment: See above HEP Plan: Patient agrees to discharge.  Patient goals were not met. Patient is being discharged due to the patient's request.  ?????     Scot Jun, PT, DPT, OCS, ATC 01/11/20  1:17 PM        [facility] [address] [place], [place], [address] Phone: [phone]   Fax:  [phone]  Name: Wendy Richards MRN: [id number] Date of Birth: 1990/12/20

## 2019-12-25 NOTE — Patient Instructions (Signed)
Access Code: [email or web address] URL: [email or web address] Date: 12/25/2019 Prepared by: Chyrel Masson  Exercises Supine Chin Tuck - 15 reps - 1 sets - 5 hold - 2x daily - 7x weekly Supine Shoulder Horizontal Abduction with Resistance - 10 reps - 3 sets - 2x daily - 7x weekly Shoulder External Rotation and Scapular Retraction - 10 reps - 3 sets - 1x daily - 7x weekly

## 2020-01-05 ENCOUNTER — Ambulatory Visit (INDEPENDENT_AMBULATORY_CARE_PROVIDER_SITE_OTHER): Payer: 59 | Admitting: Neurology

## 2020-01-05 ENCOUNTER — Encounter: Payer: Self-pay | Admitting: Neurology

## 2020-01-05 ENCOUNTER — Telehealth: Payer: Self-pay | Admitting: Neurology

## 2020-01-05 ENCOUNTER — Other Ambulatory Visit: Payer: Self-pay

## 2020-01-05 VITALS — BP 127/82 | HR 134 | Temp 97.7°F | Ht 66.0 in | Wt 129.0 lb

## 2020-01-05 DIAGNOSIS — G43711 Chronic migraine without aura, intractable, with status migrainosus: Secondary | ICD-10-CM

## 2020-01-05 MED ORDER — RIZATRIPTAN BENZOATE 10 MG PO TBDP: 10.0000 mg | ORAL_TABLET | ORAL | 11 refills | Status: DC | PRN

## 2020-01-05 NOTE — Telephone Encounter (Signed)
Botox order form pending Dr. Trevor Mace signature. Dx code R41.638. Patient signed consent while in office today. Sent to medical records for scanning.

## 2020-01-05 NOTE — Progress Notes (Addendum)
[facility]    Provider:  Dr Lucia Gaskins Requesting Provider: Naaman Plummer, MD Primary Care Provider:  Patient, No Pcp Per  CC:  Migraines  HPI:  Wendy Richards is a 29 y.o. female here as requested by Richardean Chimera, MD for migraines. PMHx hyperthyroidism, panic attacks, tachycardia.  She has managed for her lower cervical neck pain with referral pattern to the occiput by Dr. Naaman Plummer and sent here for migraines, I reviewed Dr. Atchison Blas records: She does have progressive neck pain that is axial some referral into the upper trapezius bilaterally, worse with forward flexion, some relief with extension, when she looks down she gets vertigo, pain with head rotation and sleeping at night is difficult.  Some improvement recently with a sleep number bed that she can adjust, fairly long history of multiple pain complaints including pelvic pain and history of migraine and TMJ, history of complicated migraine with facial droop 2017 referring to this is a Bell's palsy.  Her case is complicated by pain anxiety syndrome, she works at [employer] and she has had treatments there, adjustments have helped.  No radicular symptoms, no focal weakness, no numbness tingling or paresthesias.  She has always had headaches and many years of migraines. She has a lot of neck pain. She has a lot of tension headaches, feels like a sharp hot rubber band around both sides of the head daily. She has a dull headache continuously then can become more "tension type" as she describes above. She also gets a headache right in the back of the neck on the right and feels hot. Her chiropractor wanted her to do PT and she went once and did not feel it was more helpful than what she was getting at PT. She has also had dry needling. She also has migraines which are unilateral more on the left, pulsating/pounding/throbbing then spreads and is constant, light and sound sensitivity, not osmophobia, she can get nausea and  dizziness looking down, movement makes it worse Lights at night make it worse. A hot shower can help the headaches and migraines. Migraines are moderately severe to severe and affecting her life, sleep helps as does an ice pack. But neck also hurts sleeping. Migraines can last 24-72 hours untreated, ongoing at this severity and frequency > 1 year. No aura, no medication overuse. No other focal neurologic deficits, associated symptoms, inciting events or modifiable factors.  Migraine medications tried: amitriptyline, nadolol, zofran, topiramate  Reviewed notes, labs and imaging from outside physicians, which showed:  MRI showed No acute intracranial abnormalities including mass lesion or mass effect, hydrocephalus, extra-axial fluid collection, midline shift, hemorrhage, or acute infarction, large ischemic events (personally reviewed images)   Review of Systems: Patient complains of symptoms per HPI as well as the following symptoms: chronic pain. Pertinent negatives and positives per HPI. All others negative.   Social History   Socioeconomic History  . Marital status: Single    Spouse name: Not on file  . Number of children: 0  . Years of education: 91  . Highest education level: Not on file  Occupational History  . Occupation: Geophysical data processor  Tobacco Use  . Smoking status: Never Smoker  . Smokeless tobacco: Never Used  Substance and Sexual Activity  . Alcohol use: Yes    Comment: maybe a couple times a month  . Drug use: No  . Sexual activity: Yes    Birth control/protection: None  Other Topics Concern  . Not on file  Social History  Narrative   Pt lives in [place] with her mother.   Fulltime employee at [employer] at a [employer] handed   Caffeine: in the morning, 2 cups    Social Determinants of Health   Financial Resource Strain:   . Difficulty of Paying Living Expenses: Not on file  Food Insecurity:   . Worried About Charity fundraiser in  the Last Year: Not on file  . Ran Out of Food in the Last Year: Not on file  Transportation Needs:   . Lack of Transportation (Medical): Not on file  . Lack of Transportation (Non-Medical): Not on file  Physical Activity:   . Days of Exercise per Week: Not on file  . Minutes of Exercise per Session: Not on file  Stress:   . Feeling of Stress : Not on file  Social Connections:   . Frequency of Communication with Friends and Family: Not on file  . Frequency of Social Gatherings with Friends and Family: Not on file  . Attends Religious Services: Not on file  . Active Member of Clubs or Organizations: Not on file  . Attends Archivist Meetings: Not on file  . Marital Status: Not on file  Intimate Partner Violence:   . Fear of Current or Ex-Partner: Not on file  . Emotionally Abused: Not on file  . Physically Abused: Not on file  . Sexually Abused: Not on file    Family History  Problem Relation Age of Onset  . Ovarian cysts Mother   . Migraines Mother   . Hypertension Maternal Grandfather   . Cancer Maternal Grandfather        died of non hodgkins lymphoma  . Heart attack Paternal Grandfather   . Cancer Maternal Grandmother        uterine cancer  . Migraines Brother     Past Medical History:  Diagnosis Date  . Hyperthyroidism   . Interstitial cystitis   . Interstitial cystitis   . Panic attacks   . SOB (shortness of breath)   . Tachycardia     Patient Active Problem List   Diagnosis Date Noted  . Migraine with aura and without status migrainosus, not intractable 01/06/2016  . Tachycardia 10/15/2012  . Palpitations 10/15/2012  . Panic anxiety syndrome 10/15/2012    Past Surgical History:  Procedure Laterality Date  . BREAST ENHANCEMENT SURGERY    . EXTERNAL EAR SURGERY    . RHINOPLASTY      Current Outpatient Medications  Medication Sig Dispense Refill  . amphetamine-dextroamphetamine (ADDERALL XR) 30 MG 24 hr capsule Take by mouth.    .  rizatriptan (MAXALT-MLT) 10 MG disintegrating tablet Take 1 tablet (10 mg total) by mouth as needed for migraine. May repeat in 2 hours if needed 9 tablet 11   No current facility-administered medications for this visit.    Allergies as of 01/05/2020 - Review Complete 01/05/2020  Allergen Reaction Noted  . Benadryl [diphenhydramine hcl (sleep)]  01/03/2016    Vitals: BP 127/82 (BP Location: Left Arm, Patient Position: Sitting)   Pulse (!) 134 Comment: pt had coffee and reports always high in mornings  Temp 97.7 F (36.5 C) Comment: taken at front  Ht 5\' 6"  (1.676 m)   Wt 129 lb (58.5 kg)   BMI 20.82 kg/m  Last Weight:  Wt Readings from Last 1 Encounters:  01/05/20 129 lb (58.5 kg)   Last Height:   Ht Readings from Last 1 Encounters:  01/05/20 5\' 6"  (1.676 m)     Physical exam: Exam: Gen: NAD, conversant, well nourised, well groomed                     CV: RRR, no MRG. No Carotid Bruits. No peripheral edema, warm, nontender Eyes: Conjunctivae clear without exudates or hemorrhage  Neuro: Detailed Neurologic Exam  Speech:    Speech is normal; fluent and spontaneous with normal comprehension.  Cognition:    The patient is oriented to person, place, and time;     recent and remote memory intact;     language fluent;     normal attention, concentration,     fund of knowledge Cranial Nerves:    The pupils are equal, round, and reactive to light. The fundi are flt. Visual fields are full to finger confrontation. Extraocular movements are intact. Trigeminal sensation is intact and the muscles of mastication are normal. The face is symmetric. The palate elevates in the midline. Hearing intact. Voice is normal. Shoulder shrug is normal. The tongue has normal motion without fasciculations.   Coordination:    No dysmetria  Gait:    Heel-toe and tandem gait are normal.   Motor Observation:    No asymmetry, no atrophy, and no involuntary movements noted. Tone:    Normal  muscle tone.    Posture:    Posture is normal. normal erect    Strength:    Strength is V/V in the upper and lower limbs.      Sensation: intact to LT     Reflex Exam:  DTR's:    Deep tendon reflexes in the upper and lower extremities are normal bilaterally.   Toes:    The toes are downgoing bilaterally.   Clonus:    Clonus is absent.    Assessment/Plan:  28 year with chronic migraines, no aura, no medication overuse, failed 3 classes: Migraine medications tried: amitriptyline, nadolol, zofran, topiramate  Recommend botox for migraines which will also help wither her myofascial neck pain. No TSH since 2017, will check. Continue to follow with Dr. 2018.   Orders Placed This Encounter  Procedures  . TSH   Meds ordered this encounter  Medications  . rizatriptan (MAXALT-MLT) 10 MG disintegrating tablet    Sig: Take 1 tablet (10 mg total) by mouth as needed for migraine. May repeat in 2 hours if needed    Dispense:  9 tablet    Refill:  11    Cc:  Dr. Naaman Plummer  Naaman Plummer, MD  [facility] [address] [place], [place]  Phone [phone] Fax [phone]

## 2020-01-05 NOTE — Telephone Encounter (Signed)
Please initiate botox protocol and call patient with informationa nd appointment with Dr. Lucia Gaskins

## 2020-01-05 NOTE — Patient Instructions (Addendum)
Rizatriptan: Please take one tablet right at the onset of your headache. If it does not improve the symptoms please take one additional tablet. Do not take more then 2 tablets in 24hrs. Do not take use more then 2 to 3 times in a week.  Rizatriptan tablets What is this medicine? RIZATRIPTAN (rye za TRIP tan) is used to treat migraines with or without aura. An aura is a strange feeling or visual disturbance that warns you of an attack. It is not used to prevent migraines. This medicine may be used for other purposes; ask your health care provider or pharmacist if you have questions. COMMON BRAND NAME(S): Maxalt What should I tell my health care provider before I take this medicine? They need to know if you have any of these conditions:  cigarette smoker  circulation problems in fingers and toes  diabetes  heart disease  high blood pressure  high cholesterol  history of irregular heartbeat  history of stroke  kidney disease  liver disease  stomach or intestine problems  an unusual or allergic reaction to rizatriptan, other medicines, foods, dyes, or preservatives  pregnant or trying to get pregnant  breast-feeding How should I use this medicine? Take this medicine by mouth with a glass of water. Follow the directions on the prescription label. Do not take it more often than directed. Talk to your pediatrician regarding the use of this medicine in children. While this drug may be prescribed for children as young as 6 years for selected conditions, precautions do apply. Overdosage: If you think you have taken too much of this medicine contact a poison control center or emergency room at once. NOTE: This medicine is only for you. Do not share this medicine with others. What if I miss a dose? This does not apply. This medicine is not for regular use. What may interact with this medicine? Do not take this medicine with any of the following medicines:  certain medicines for  migraine headache like almotriptan, eletriptan, frovatriptan, naratriptan, rizatriptan, sumatriptan, zolmitriptan  ergot alkaloids like dihydroergotamine, ergonovine, ergotamine, methylergonovine  MAOIs like Carbex, Eldepryl, Marplan, Nardil, and Parnate This medicine may also interact with the following medications:  certain medicines for depression, anxiety, or psychotic disorders  propranolol This list may not describe all possible interactions. Give your health care provider a list of all the medicines, herbs, non-prescription drugs, or dietary supplements you use. Also tell them if you smoke, drink alcohol, or use illegal drugs. Some items may interact with your medicine. What should I watch for while using this medicine? Visit your healthcare professional for regular checks on your progress. Tell your healthcare professional if your symptoms do not start to get better or if they get worse. You may get drowsy or dizzy. Do not drive, use machinery, or do anything that needs mental alertness until you know how this medicine affects you. Do not stand up or sit up quickly, especially if you are an older patient. This reduces the risk of dizzy or fainting spells. Alcohol may interfere with the effect of this medicine. Your mouth may get dry. Chewing sugarless gum or sucking hard candy and drinking plenty of water may help. Contact your healthcare professional if the problem does not go away or is severe. If you take migraine medicines for 10 or more days a month, your migraines may get worse. Keep a diary of headache days and medicine use. Contact your healthcare professional if your migraine attacks occur more frequently. What side effects  may I notice from receiving this medicine? Side effects that you should report to your doctor or health care professional as soon as possible:  allergic reactions like skin rash, itching or hives, swelling of the face, lips, or tongue  chest pain or chest  tightness  signs and symptoms of a dangerous change in heartbeat or heart rhythm like chest pain; dizziness; fast, irregular heartbeat; palpitations; feeling faint or lightheaded; falls; breathing problems  signs and symptoms of a stroke like changes in vision; confusion; trouble speaking or understanding; severe headaches; sudden numbness or weakness of the face, arm or leg; trouble walking; dizziness; loss of balance or coordination  signs and symptoms of serotonin syndrome like irritable; confusion; diarrhea; fast or irregular heartbeat; muscle twitching; stiff muscles; trouble walking; sweating; high fever; seizures; chills; vomiting Side effects that usually do not require medical attention (report to your doctor or health care professional if they continue or are bothersome):  diarrhea  dizziness  drowsiness  dry mouth  headache  nausea, vomiting  pain, tingling, numbness in the hands or feet  stomach pain This list may not describe all possible side effects. Call your doctor for medical advice about side effects. You may report side effects to FDA at 1-800-FDA-1088. Where should I keep my medicine? Keep out of the reach of children. Store at room temperature between 15 and 30 degrees C (59 and 86 degrees F). Keep container tightly closed. Throw away any unused medicine after the expiration date. NOTE: This sheet is a summary. It may not cover all possible information. If you have questions about this medicine, talk to your doctor, pharmacist, or health care provider.  2020 Elsevier/Gold Standard (2018-05-06 14:59:59)   OnabotulinumtoxinA injection (Medical Use) What is this medicine? ONABOTULINUMTOXINA (o na BOTT you lye num tox in eh) is a neuro-muscular blocker. This medicine is used to treat crossed eyes, eyelid spasms, severe neck muscle spasms, ankle and toe muscle spasms, and elbow, wrist, and finger muscle spasms. It is also used to treat excessive underarm sweating,  to prevent chronic migraine headaches, and to treat loss of bladder control due to neurologic conditions such as multiple sclerosis or spinal cord injury. This medicine may be used for other purposes; ask your health care provider or pharmacist if you have questions. COMMON BRAND NAME(S): Botox What should I tell my health care provider before I take this medicine? They need to know if you have any of these conditions:  breathing problems  cerebral palsy spasms  difficulty urinating  heart problems  history of surgery where this medicine is going to be used  infection at the site where this medicine is going to be used  myasthenia gravis or other neurologic disease  nerve or muscle disease  surgery plans  take medicines that treat or prevent blood clots  thyroid problems  an unusual or allergic reaction to botulinum toxin, albumin, other medicines, foods, dyes, or preservatives  pregnant or trying to get pregnant  breast-feeding How should I use this medicine? This medicine is for injection into a muscle. It is given by a health care professional in a hospital or clinic setting. Talk to your pediatrician regarding the use of this medicine in children. While this drug may be prescribed for children as young as 26 years old for selected conditions, precautions do apply. Overdosage: If you think you have taken too much of this medicine contact a poison control center or emergency room at once. NOTE: This medicine is only for you.  Do not share this medicine with others. What if I miss a dose? This does not apply. What may interact with this medicine?  aminoglycoside antibiotics like gentamicin, neomycin, tobramycin  muscle relaxants  other botulinum toxin injections This list may not describe all possible interactions. Give your health care provider a list of all the medicines, herbs, non-prescription drugs, or dietary supplements you use. Also tell them if you smoke, drink  alcohol, or use illegal drugs. Some items may interact with your medicine. What should I watch for while using this medicine? Visit your doctor for regular check ups. This medicine will cause weakness in the muscle where it is injected. Tell your doctor if you feel unusually weak in other muscles. Get medical help right away if you have problems with breathing, swallowing, or talking. This medicine might make your eyelids droop or make you see blurry or double. If you have weak muscles or trouble seeing do not drive a car, use machinery, or do other dangerous activities. This medicine contains albumin from human blood. It may be possible to pass an infection in this medicine, but no cases have been reported. Talk to your doctor about the risks and benefits of this medicine. If your activities have been limited by your condition, go back to your regular routine slowly after treatment with this medicine. What side effects may I notice from receiving this medicine? Side effects that you should report to your doctor or health care professional as soon as possible:  allergic reactions like skin rash, itching or hives, swelling of the face, lips, or tongue  breathing problems  changes in vision  chest pain or tightness  eye irritation, pain  fast, irregular heartbeat  infection  numbness  speech problems  swallowing problems  unusual weakness Side effects that usually do not require medical attention (report to your doctor or health care professional if they continue or are bothersome):  bruising or pain at site where injected  drooping eyelid  dry eyes or mouth  headache  muscles aches, pains  sensitivity to light  tearing This list may not describe all possible side effects. Call your doctor for medical advice about side effects. You may report side effects to FDA at 1-800-FDA-1088. Where should I keep my medicine? This drug is given in a hospital or clinic and will not be  stored at home. NOTE: This sheet is a summary. It may not cover all possible information. If you have questions about this medicine, talk to your doctor, pharmacist, or health care provider.  2020 Elsevier/Gold Standard (2018-04-28 14:21:42)

## 2020-01-05 NOTE — Addendum Note (Signed)
Addended by: Naomie Dean B on: 01/05/2020 08:46 AM   Modules accepted: Orders

## 2020-01-06 LAB — TSH: TSH: 0.674 u[IU]/mL (ref 0.450–4.500)

## 2020-01-06 MED ORDER — BOTOX 100 UNITS IJ SOLR: INTRAMUSCULAR | 1 refills | Status: DC

## 2020-01-06 NOTE — Addendum Note (Signed)
Addended by: Bertram Savin on: 01/06/2020 03:40 PM   Modules accepted: Orders

## 2020-01-06 NOTE — Telephone Encounter (Signed)
Noted, thank you

## 2020-01-06 NOTE — Telephone Encounter (Signed)
I called and scheduled the patient for first available apt, we also went over the SP protocol. Please send script for Botox to [facility].

## 2020-01-06 NOTE — Telephone Encounter (Signed)
Botox order e-scribed to [facility] pharmacy in [facility].

## 2020-01-11 ENCOUNTER — Encounter: Payer: 59 | Admitting: Physical Therapy

## 2020-01-15 ENCOUNTER — Encounter: Payer: 59 | Admitting: Physical Therapy

## 2020-01-18 ENCOUNTER — Encounter: Payer: 59 | Admitting: Physical Therapy

## 2020-01-20 ENCOUNTER — Encounter: Payer: 59 | Admitting: Physical Therapy

## 2020-01-26 ENCOUNTER — Encounter: Payer: 59 | Admitting: Physical Therapy

## 2020-01-26 NOTE — Telephone Encounter (Signed)
PA obtained from [facility] for [phone] and [phone] via fax.  RR#[id number] Valid from 02/01/2020-05/01/2020.  PA also obtained for from [facility] with [facility] [phone]) for the Botox, PA# [id number] Valid from 01/26/2020-01/25/2021.  I then called [facility] at [phone] to schedule delivery of medication.  Delivery pending patient consent.  They are aware patients appointment is 02/01/2020 and will try to contact patient asap so delivery can be scheduled for 01/28/2020.  I called patient to let her know they will be reaching out to her.

## 2020-01-27 NOTE — Telephone Encounter (Signed)
I called [facility] [phone] and scheduled delivery for 01/28/2020 with Jamal.

## 2020-01-29 ENCOUNTER — Encounter: Payer: 59 | Admitting: Physical Therapy

## 2020-02-01 ENCOUNTER — Ambulatory Visit (INDEPENDENT_AMBULATORY_CARE_PROVIDER_SITE_OTHER): Payer: 59 | Admitting: Neurology

## 2020-02-01 ENCOUNTER — Other Ambulatory Visit: Payer: Self-pay

## 2020-02-01 DIAGNOSIS — G43711 Chronic migraine without aura, intractable, with status migrainosus: Secondary | ICD-10-CM

## 2020-02-01 NOTE — Progress Notes (Signed)
Botox- 100 units x 2 vials Lot: [id number] Expiration: 10/2022 NDC: 7588-3254-98  Bacteriostatic 0.9% Sodium Chloride- 45mL total Lot: [id number] Expiration: 02/04/2020 NDC: 3094-0768-08  Dx: U11.031 S/P

## 2020-02-01 NOTE — Progress Notes (Signed)
Consent Form Botulism Toxin Injection For Chronic Migraine  02/01/2020: this is our first botox.  Reviewed orally with patient, additionally signature is on file:  Botulism toxin has been approved by the Federal drug administration for treatment of chronic migraine. Botulism toxin does not cure chronic migraine and it may not be effective in some patients.  The administration of botulism toxin is accomplished by injecting a small amount of toxin into the muscles of the neck and head. Dosage must be titrated for each individual. Any benefits resulting from botulism toxin tend to wear off after 3 months with a repeat injection required if benefit is to be maintained. Injections are usually done every 3-4 months with maximum effect peak achieved by about 2 or 3 weeks. Botulism toxin is expensive and you should be sure of what costs you will incur resulting from the injection.  The side effects of botulism toxin use for chronic migraine may include:   -Transient, and usually mild, facial weakness with facial injections  -Transient, and usually mild, head or neck weakness with head/neck injections  -Reduction or loss of forehead facial animation due to forehead muscle weakness  -Eyelid drooping  -Dry eye  -Pain at the site of injection or bruising at the site of injection  -Double vision  -Potential unknown long term risks  Contraindications: You should not have Botox if you are pregnant, nursing, allergic to albumin, have an infection, skin condition, or muscle weakness at the site of the injection, or have myasthenia gravis, Lambert-Eaton syndrome, or ALS.  It is also possible that as with any injection, there may be an allergic reaction or no effect from the medication. Reduced effectiveness after repeated injections is sometimes seen and rarely infection at the injection site may occur. All care will be taken to prevent these side effects. If therapy is given over a long time, atrophy and  wasting in the muscle injected may occur. Occasionally the patient's become refractory to treatment because they develop antibodies to the toxin. In this event, therapy needs to be modified.  I have read the above information and consent to the administration of botulism toxin.    BOTOX PROCEDURE NOTE FOR MIGRAINE HEADACHE    Contraindications and precautions discussed with patient(above). Aseptic procedure was observed and patient tolerated procedure. Procedure performed by Dr. Artemio Aly  The condition has existed for more than 6 months, and pt does not have a diagnosis of ALS, Myasthenia Gravis or Lambert-Eaton Syndrome.  Risks and benefits of injections discussed and pt agrees to proceed with the procedure.  Written consent obtained  These injections are medically necessary. Pt  receives good benefits from these injections. These injections do not cause sedations or hallucinations which the oral therapies may cause.  Description of procedure:  The patient was placed in a sitting position. The standard protocol was used for Botox as follows, with 5 units of Botox injected at each site:   -Procerus muscle, midline injection  -Corrugator muscle, bilateral injection  -Frontalis muscle, bilateral injection, with 2 sites each side, medial injection was performed in the upper one third of the frontalis muscle, in the region vertical from the medial inferior edge of the superior orbital rim. The lateral injection was again in the upper one third of the forehead vertically above the lateral limbus of the cornea, 1.5 cm lateral to the medial injection site.  -Temporalis muscle injection, 4 sites, bilaterally. The first injection was 3 cm above the tragus of the ear, second injection site  was 1.5 cm to 3 cm up from the first injection site in line with the tragus of the ear. The third injection site was 1.5-3 cm forward between the first 2 injection sites. The fourth injection site was 1.5 cm  posterior to the second injection site.   -Occipitalis muscle injection, 3 sites, bilaterally. The first injection was done one half way between the occipital protuberance and the tip of the mastoid process behind the ear. The second injection site was done lateral and superior to the first, 1 fingerbreadth from the first injection. The third injection site was 1 fingerbreadth superiorly and medially from the first injection site.  -Cervical paraspinal muscle injection, 2 sites, bilateral knee first injection site was 1 cm from the midline of the cervical spine, 3 cm inferior to the lower border of the occipital protuberance. The second injection site was 1.5 cm superiorly and laterally to the first injection site.  -Trapezius muscle injection was performed at 3 sites, bilaterally. The first injection site was in the upper trapezius muscle halfway between the inflection point of the neck, and the acromion. The second injection site was one half way between the acromion and the first injection site. The third injection was done between the first injection site and the inflection point of the neck.   Will return for repeat injection in 3 months.   200 units of Botox was used, any Botox not injected was wasted. The patient tolerated the procedure well, there were no complications of the above procedure.

## 2020-04-25 ENCOUNTER — Telehealth: Payer: Self-pay | Admitting: Neurology

## 2020-04-25 NOTE — Telephone Encounter (Signed)
Toma Copier, can you update the pharmacy to [facility]? I think it is still listed under [facility] in the chart.

## 2020-04-25 NOTE — Telephone Encounter (Addendum)
I called [facility] (formally known as [facility]) at [phone] and I spoke with Revonda Standard about scheduling delivery of Botox for patient's 05/05/20 appointment. She states that they have not reached out to patient yet for consent but plan to today. I called patient to advise her. She did not answer and VM is full. I will send her a MyChart message to make sure she is aware. I also noticed in previous notes that patient has a PA on file with [facility], but it is set to expire on 05/01/20. I called [facility] [phone]) and spoke with Wynona Meals to initiate new PA. He states codes [phone] and [phone] both require PA, but system does not allow them to start PA over the phone. He directed me to fax the PA request to [phone]). I faxed it and I am awaiting response.  PA on file with [facility], PA# [id number]. Valid from 01/26/2020-01/25/2021.

## 2020-04-25 NOTE — Telephone Encounter (Signed)
Pharmacy was changed to [facility] and Sallyanne Kuster was taken out

## 2020-04-26 NOTE — Telephone Encounter (Signed)
I received a denial via fax from [facility], stating that reason being is because patient does not have headaches 15 days a month. Per Dr. Trevor Mace office notes, patient had daily headaches before Botox. I attached those notes and faxed them back to [facility] asking them to review. I called the patient and was able to speak to her about calling [facility] and giving her consent for shipment. I gave her the number to [facility] [phone]) she states she will call them today.

## 2020-04-26 NOTE — Telephone Encounter (Signed)
Jody from [facility] called and scheduled patient's Botox delivery for 05/03/20 for patient's 05/05/20 appointment.

## 2020-05-03 NOTE — Telephone Encounter (Signed)
(  2) 100U vials of Botox delivered from [facility] today for patient's 05/05/20 appt.

## 2020-05-05 ENCOUNTER — Ambulatory Visit: Payer: 59 | Admitting: Neurology

## 2020-05-05 DIAGNOSIS — G43711 Chronic migraine without aura, intractable, with status migrainosus: Secondary | ICD-10-CM | POA: Diagnosis not present

## 2020-05-05 DIAGNOSIS — E538 Deficiency of other specified B group vitamins: Secondary | ICD-10-CM

## 2020-05-05 MED ORDER — CYANOCOBALAMIN 1000 MCG/ML IJ SOLN: 1000.0000 ug | INTRAMUSCULAR | 3 refills | Status: DC

## 2020-05-05 NOTE — Progress Notes (Signed)
Botox- 100 units x 2 vials Lot: [id number] Expiration: 08/2022 NDC: 0023-1145-01  Bacteriostatic 0.9% Sodium Chloride- 4mL total Lot: [id number] Expiration: 08/05/2021 NDC: 0409-1966-02  Dx: G43.711 S/P   

## 2020-05-05 NOTE — Progress Notes (Signed)
Consent Form Botulism Toxin Injection For Chronic Migraine    May 05, 2020: This is her second Botox.  Patient's baseline was daily headaches as well as daily migraines. She has done excellent.  She has >50% improvement in migraine frequency and the other migraines are > 50% better in severity and duration. Ask next time about masseters. We put extra in the left cervical and trap areas where she has significant   02/01/2020: this is our first botox.  Reviewed orally with patient, additionally signature is on file:  Botulism toxin has been approved by the Federal drug administration for treatment of chronic migraine. Botulism toxin does not cure chronic migraine and it may not be effective in some patients.  The administration of botulism toxin is accomplished by injecting a small amount of toxin into the muscles of the neck and head. Dosage must be titrated for each individual. Any benefits resulting from botulism toxin tend to wear off after 3 months with a repeat injection required if benefit is to be maintained. Injections are usually done every 3-4 months with maximum effect peak achieved by about 2 or 3 weeks. Botulism toxin is expensive and you should be sure of what costs you will incur resulting from the injection.  The side effects of botulism toxin use for chronic migraine may include:   -Transient, and usually mild, facial weakness with facial injections  -Transient, and usually mild, head or neck weakness with head/neck injections  -Reduction or loss of forehead facial animation due to forehead muscle weakness  -Eyelid drooping  -Dry eye  -Pain at the site of injection or bruising at the site of injection  -Double vision  -Potential unknown long term risks  Contraindications: You should not have Botox if you are pregnant, nursing, allergic to albumin, have an infection, skin condition, or muscle weakness at the site of the injection, or have myasthenia gravis, Lambert-Eaton  syndrome, or ALS.  It is also possible that as with any injection, there may be an allergic reaction or no effect from the medication. Reduced effectiveness after repeated injections is sometimes seen and rarely infection at the injection site may occur. All care will be taken to prevent these side effects. If therapy is given over a long time, atrophy and wasting in the muscle injected may occur. Occasionally the patient's become refractory to treatment because they develop antibodies to the toxin. In this event, therapy needs to be modified.  I have read the above information and consent to the administration of botulism toxin.    BOTOX PROCEDURE NOTE FOR MIGRAINE HEADACHE    Contraindications and precautions discussed with patient(above). Aseptic procedure was observed and patient tolerated procedure. Procedure performed by Dr. Artemio Aly  The condition has existed for more than 6 months, and pt does not have a diagnosis of ALS, Myasthenia Gravis or Lambert-Eaton Syndrome.  Risks and benefits of injections discussed and pt agrees to proceed with the procedure.  Written consent obtained  These injections are medically necessary. Pt  receives good benefits from these injections. These injections do not cause sedations or hallucinations which the oral therapies may cause.  Description of procedure:  The patient was placed in a sitting position. The standard protocol was used for Botox as follows, with 5 units of Botox injected at each site:   -Procerus muscle, midline injection  -Corrugator muscle, bilateral injection  -Frontalis muscle, bilateral injection, with 2 sites each side, medial injection was performed in the upper one third of the frontalis  muscle, in the region vertical from the medial inferior edge of the superior orbital rim. The lateral injection was again in the upper one third of the forehead vertically above the lateral limbus of the cornea, 1.5 cm lateral to the medial  injection site.  -Temporalis muscle injection, 4 sites, bilaterally. The first injection was 3 cm above the tragus of the ear, second injection site was 1.5 cm to 3 cm up from the first injection site in line with the tragus of the ear. The third injection site was 1.5-3 cm forward between the first 2 injection sites. The fourth injection site was 1.5 cm posterior to the second injection site.   -Occipitalis muscle injection, 3 sites, bilaterally. The first injection was done one half way between the occipital protuberance and the tip of the mastoid process behind the ear. The second injection site was done lateral and superior to the first, 1 fingerbreadth from the first injection. The third injection site was 1 fingerbreadth superiorly and medially from the first injection site.  -Cervical paraspinal muscle injection, 2 sites, bilateral knee first injection site was 1 cm from the midline of the cervical spine, 3 cm inferior to the lower border of the occipital protuberance. The second injection site was 1.5 cm superiorly and laterally to the first injection site.  -Trapezius muscle injection was performed at 3 sites, bilaterally. The first injection site was in the upper trapezius muscle halfway between the inflection point of the neck, and the acromion. The second injection site was one half way between the acromion and the first injection site. The third injection was done between the first injection site and the inflection point of the neck.   Will return for repeat injection in 3 months.   200 units of Botox was used, any Botox not injected was wasted. The patient tolerated the procedure well, there were no complications of the above procedure.

## 2020-05-11 NOTE — Telephone Encounter (Signed)
[facility] approved PA request for [phone] and [id number] after review. PA #[id number]. Valid 05/05/20 to 08/03/20.

## 2020-06-10 ENCOUNTER — Ambulatory Visit: Payer: 59 | Admitting: Medical

## 2020-07-07 ENCOUNTER — Telehealth: Payer: Self-pay | Admitting: *Deleted

## 2020-07-07 MED ORDER — BOTOX 100 UNITS IJ SOLR: INTRAMUSCULAR | 2 refills | Status: DC

## 2020-07-07 NOTE — Telephone Encounter (Signed)
-----   Message from Brennan Bailey sent at 07/07/2020  1:41 PM EDT ----- Regarding: Refill I received a faxed refill request from [facility] for patient. Can you please send in more refills for Botox?

## 2020-08-02 NOTE — Telephone Encounter (Signed)
I called [facility] and spoke with Boneta Lucks to schedule delivery of Botox for patient's 10/6 appointment. She was able to schedule delivery for 10/5.   Patient has PA on file with [facility]. PA #[id number] (05/05/20- 08/03/20). It will expire before her 10/6 appointment. I have filled out PA request form and will fax it with clinical notes.

## 2020-08-04 NOTE — Telephone Encounter (Signed)
Received PA approval via fax from [facility]. PA #[id number] (08/10/20- 11/08/20).

## 2020-08-09 NOTE — Progress Notes (Signed)
Consent Form Botulism Toxin Injection For Chronic Migraine    08/10/2020: doing very well. Did the frontalis high up to avoid changing her eye brows at all. Next time she can tell me if she had an asysmmtery of the left brow and we can even it out "spock brow). +2unots each masseter. +5 each orb oculi.   May 05, 2020: This is her second Botox.  Patient's baseline was daily headaches as well as daily migraines. She has done excellent.  She has >50% improvement in migraine frequency and the other migraines are > 50% better in severity and duration. Ask next time about masseters. We put extra in the left cervical and trap areas where she has significant   02/01/2020: this is our first botox.  Reviewed orally with patient, additionally signature is on file:  Botulism toxin has been approved by the Federal drug administration for treatment of chronic migraine. Botulism toxin does not cure chronic migraine and it may not be effective in some patients.  The administration of botulism toxin is accomplished by injecting a small amount of toxin into the muscles of the neck and head. Dosage must be titrated for each individual. Any benefits resulting from botulism toxin tend to wear off after 3 months with a repeat injection required if benefit is to be maintained. Injections are usually done every 3-4 months with maximum effect peak achieved by about 2 or 3 weeks. Botulism toxin is expensive and you should be sure of what costs you will incur resulting from the injection.  The side effects of botulism toxin use for chronic migraine may include:   -Transient, and usually mild, facial weakness with facial injections  -Transient, and usually mild, head or neck weakness with head/neck injections  -Reduction or loss of forehead facial animation due to forehead muscle weakness  -Eyelid drooping  -Dry eye  -Pain at the site of injection or bruising at the site of injection  -Double vision  -Potential unknown  long term risks  Contraindications: You should not have Botox if you are pregnant, nursing, allergic to albumin, have an infection, skin condition, or muscle weakness at the site of the injection, or have myasthenia gravis, Lambert-Eaton syndrome, or ALS.  It is also possible that as with any injection, there may be an allergic reaction or no effect from the medication. Reduced effectiveness after repeated injections is sometimes seen and rarely infection at the injection site may occur. All care will be taken to prevent these side effects. If therapy is given over a long time, atrophy and wasting in the muscle injected may occur. Occasionally the patient's become refractory to treatment because they develop antibodies to the toxin. In this event, therapy needs to be modified.  I have read the above information and consent to the administration of botulism toxin.    BOTOX PROCEDURE NOTE FOR MIGRAINE HEADACHE    Contraindications and precautions discussed with patient(above). Aseptic procedure was observed and patient tolerated procedure. Procedure performed by Dr. Artemio Aly  The condition has existed for more than 6 months, and pt does not have a diagnosis of ALS, Myasthenia Gravis or Lambert-Eaton Syndrome.  Risks and benefits of injections discussed and pt agrees to proceed with the procedure.  Written consent obtained  These injections are medically necessary. Pt  receives good benefits from these injections. These injections do not cause sedations or hallucinations which the oral therapies may cause.  Description of procedure:  The patient was placed in a sitting position. The standard  protocol was used for Botox as follows, with 5 units of Botox injected at each site:   -Procerus muscle, midline injection  -Corrugator muscle, bilateral injection  -Frontalis muscle, bilateral injection, with 2 sites each side, medial injection was performed in the upper one third of the frontalis  muscle, in the region vertical from the medial inferior edge of the superior orbital rim. The lateral injection was again in the upper one third of the forehead vertically above the lateral limbus of the cornea, 1.5 cm lateral to the medial injection site.  -Temporalis muscle injection, 4 sites, bilaterally. The first injection was 3 cm above the tragus of the ear, second injection site was 1.5 cm to 3 cm up from the first injection site in line with the tragus of the ear. The third injection site was 1.5-3 cm forward between the first 2 injection sites. The fourth injection site was 1.5 cm posterior to the second injection site.   -Occipitalis muscle injection, 3 sites, bilaterally. The first injection was done one half way between the occipital protuberance and the tip of the mastoid process behind the ear. The second injection site was done lateral and superior to the first, 1 fingerbreadth from the first injection. The third injection site was 1 fingerbreadth superiorly and medially from the first injection site.  -Cervical paraspinal muscle injection, 2 sites, bilateral knee first injection site was 1 cm from the midline of the cervical spine, 3 cm inferior to the lower border of the occipital protuberance. The second injection site was 1.5 cm superiorly and laterally to the first injection site.  -Trapezius muscle injection was performed at 3 sites, bilaterally. The first injection site was in the upper trapezius muscle halfway between the inflection point of the neck, and the acromion. The second injection site was one half way between the acromion and the first injection site. The third injection was done between the first injection site and the inflection point of the neck.   Will return for repeat injection in 3 months.   200 units of Botox was used, any Botox not injected was wasted. The patient tolerated the procedure well, there were no complications of the above procedure.

## 2020-08-09 NOTE — Telephone Encounter (Signed)
(  2) 100U Botox vials delivered today from [facility].

## 2020-08-10 ENCOUNTER — Ambulatory Visit (INDEPENDENT_AMBULATORY_CARE_PROVIDER_SITE_OTHER): Payer: 59 | Admitting: Neurology

## 2020-08-10 DIAGNOSIS — G43711 Chronic migraine without aura, intractable, with status migrainosus: Secondary | ICD-10-CM | POA: Diagnosis not present

## 2020-08-10 NOTE — Progress Notes (Signed)
Botox- 100 units x 2 vials Lot: [id number] Expiration: 12/2022 NDC: 9449-6759-16  Bacteriostatic 0.9% Sodium Chloride- 61mL total Lot: [id number] Expiration: 08/05/2021 NDC: 9935-7017-79  Dx: T90.300 S/P

## 2020-08-11 ENCOUNTER — Other Ambulatory Visit: Payer: Self-pay | Admitting: Neurology

## 2020-08-11 NOTE — Telephone Encounter (Signed)
I spoke with [facility]. They confirmed the pt still has 4 vials left on the prescription. They will get this ready for the pt to pickup.

## 2020-08-16 ENCOUNTER — Ambulatory Visit: Payer: 59 | Admitting: Neurology

## 2020-09-02 ENCOUNTER — Other Ambulatory Visit: Payer: Self-pay | Admitting: Neurology

## 2020-09-21 ENCOUNTER — Other Ambulatory Visit: Payer: Self-pay | Admitting: Neurology

## 2020-09-22 ENCOUNTER — Other Ambulatory Visit: Payer: Self-pay | Admitting: Neurology

## 2020-09-22 MED ORDER — CYANOCOBALAMIN 1000 MCG/ML IJ SOLN
1000.0000 ug | INTRAMUSCULAR | 3 refills | Status: DC
Start: 2020-09-22 — End: 2021-01-12

## 2020-10-18 ENCOUNTER — Telehealth: Payer: Self-pay | Admitting: Neurology

## 2020-10-18 NOTE — Telephone Encounter (Signed)
Patient has a Botox appointment on 1/11. She has a PA on file with [facility] that will expire 1/4. I filled out new PA request and faxed to [facility] with clinical notes.

## 2020-11-07 NOTE — Telephone Encounter (Addendum)
I have not received a response from [facility] regarding outcome of PA request. I called [facility] and spoke with Casimiro Needle to check status. He states Botox has been approved. PA #[id number] (10/18/20- 01/16/21).   I called [facility] and spoke with Earney Mallet to see if Botox delivery could be scheduled. She states patient's insurance apparently termed in December and the patient now has [employer]. I called the patient to verify this information. The patient states that this information is incorrect, she does not have [employer], and she actually just paid to continue coverage with [facility]. She confirmed that [facility] is the only coverage she has.  I called [facility] back and spoke with Maralyn Sago. She states that patients with [facility] insurance actually now need to go through [facility]. Maralyn Sago provided me with their number [phone]).

## 2020-11-09 NOTE — Telephone Encounter (Addendum)
I called [facility] and spoke with Kathlene November. He states the correct area for me to be in with this patient is [facility]. He transferred me over where I spoke with [facility]. She states that they have received the prescription from [facility], and she is confident they can have it ready by patient's appointment. She gave me their direct number, which is [phone]. I then called the patient and LVM to advise of the change in pharmacies. I provided number to [facility] in my message.

## 2020-11-15 ENCOUNTER — Ambulatory Visit (INDEPENDENT_AMBULATORY_CARE_PROVIDER_SITE_OTHER): Payer: 59 | Admitting: Neurology

## 2020-11-15 ENCOUNTER — Other Ambulatory Visit: Payer: Self-pay

## 2020-11-15 DIAGNOSIS — G43711 Chronic migraine without aura, intractable, with status migrainosus: Secondary | ICD-10-CM | POA: Diagnosis not present

## 2020-11-15 NOTE — Progress Notes (Signed)
Consent Form Botulism Toxin Injection For Chronic Migraine    11/15/2020: doing very well.  Patient's baseline was daily headaches as well as daily migraines. She only gets some headaches in the time running up to her next botox and may have a few migraines a week prior when botox wears off. But still > 75% improvement in migraine frequency. Did the frontalis high up to avoid changing her eye brows at all but didn't work so this time I put 3 units above left brow and 1 unit above the right. Next time she can tell me if she had an asysmmtery of the either brow and we can modify to try and avoid "spock brow). +2unots each masseter. +5 each orb oculi. Put 15 extra units left paraspinal and left trap due to tightness. 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 She had microneedling with [place] about a month ago, she is very happy with it.   08/10/2020: doing very well. Did the frontalis high up to avoid changing her eye brows at all. Next time she can tell me if she had an asysmmtery of the left brow and we can even it out "spock brow). +2unots each masseter. +5 each orb oculi.   May 05, 2020: This is her second Botox.  Patient's baseline was daily headaches as well as daily migraines. She has done excellent.  She has >50% improvement in migraine frequency and the other migraines are > 50% better in severity and duration. Ask next time about masseters. We put extra in the left cervical and trap areas where she has significant   02/01/2020: this is our first botox.  Reviewed orally with patient, additionally signature is on file:  Botulism toxin has been approved by the Federal drug administration for treatment of chronic migraine. Botulism toxin does not cure chronic migraine and it may not be effective in some patients.  The administration of botulism toxin is accomplished by injecting a small amount of toxin into the muscles of the neck and head.  Dosage must be titrated for each individual. Any benefits resulting from botulism toxin tend to wear off after 3 months with a repeat injection required if benefit is to be maintained. Injections are usually done every 3-4 months with maximum effect peak achieved by about 2 or 3 weeks. Botulism toxin is expensive and you should be sure of what costs you will incur resulting from the injection.  The side effects of botulism toxin use for chronic migraine may include:   -Transient, and usually mild, facial weakness with facial injections  -Transient, and usually mild, head or neck weakness with head/neck injections  -Reduction or loss of forehead facial animation due to forehead muscle weakness  -Eyelid drooping  -Dry eye  -Pain at the site of injection or bruising at the site of injection  -Double vision  -Potential unknown long term risks  Contraindications: You should not have Botox if you are pregnant, nursing, allergic to albumin, have an infection, skin condition, or muscle weakness at the site of the injection, or have myasthenia gravis, Lambert-Eaton syndrome, or ALS.  It is also possible that as with any injection, there may be an allergic reaction or no effect from the medication. Reduced effectiveness after repeated injections is sometimes seen and rarely infection at the injection site may occur. All care will be taken to prevent these side effects. If therapy is given over a long time, atrophy and  wasting in the muscle injected may occur. Occasionally the patient's become refractory to treatment because they develop antibodies to the toxin. In this event, therapy needs to be modified.  I have read the above information and consent to the administration of botulism toxin.    BOTOX PROCEDURE NOTE FOR MIGRAINE HEADACHE    Contraindications and precautions discussed with patient(above). Aseptic procedure was observed and patient tolerated procedure. Procedure performed by Dr. Artemio Aly  The condition has existed for more than 6 months, and pt does not have a diagnosis of ALS, Myasthenia Gravis or Lambert-Eaton Syndrome.  Risks and benefits of injections discussed and pt agrees to proceed with the procedure.  Written consent obtained  These injections are medically necessary. Pt  receives good benefits from these injections. These injections do not cause sedations or hallucinations which the oral therapies may cause.  Description of procedure:  The patient was placed in a sitting position. The standard protocol was used for Botox as follows, with 5 units of Botox injected at each site:   -Procerus muscle, midline injection  -Corrugator muscle, bilateral injection  -Frontalis muscle, bilateral injection, with 2 sites each side, medial injection was performed in the upper one third of the frontalis muscle, in the region vertical from the medial inferior edge of the superior orbital rim. The lateral injection was again in the upper one third of the forehead vertically above the lateral limbus of the cornea, 1.5 cm lateral to the medial injection site.  -Temporalis muscle injection, 4 sites, bilaterally. The first injection was 3 cm above the tragus of the ear, second injection site was 1.5 cm to 3 cm up from the first injection site in line with the tragus of the ear. The third injection site was 1.5-3 cm forward between the first 2 injection sites. The fourth injection site was 1.5 cm posterior to the second injection site.   -Occipitalis muscle injection, 3 sites, bilaterally. The first injection was done one half way between the occipital protuberance and the tip of the mastoid process behind the ear. The second injection site was done lateral and superior to the first, 1 fingerbreadth from the first injection. The third injection site was 1 fingerbreadth superiorly and medially from the first injection site.  -Cervical paraspinal muscle injection, 2 sites, bilateral knee  first injection site was 1 cm from the midline of the cervical spine, 3 cm inferior to the lower border of the occipital protuberance. The second injection site was 1.5 cm superiorly and laterally to the first injection site.  -Trapezius muscle injection was performed at 3 sites, bilaterally. The first injection site was in the upper trapezius muscle halfway between the inflection point of the neck, and the acromion. The second injection site was one half way between the acromion and the first injection site. The third injection was done between the first injection site and the inflection point of the neck.   Will return for repeat injection in 3 months.   200 units of Botox was used, any Botox not injected was wasted. The patient tolerated the procedure well, there were no complications of the above procedure.

## 2020-11-15 NOTE — Telephone Encounter (Addendum)
Patient has an appointment today but be have not received Botox. I called [facility] and spoke with Marylene Land to check status of Botox order. Marylene Land states the order appears to still be processing but should be finished within 24 hours. Once complete, they will reach out to patient to get consent. We will keep patient's appointment and use office stock, replacing when Botox arrives from pharmacy.

## 2020-11-15 NOTE — Telephone Encounter (Signed)
Noted thanks °

## 2020-11-15 NOTE — Progress Notes (Signed)
Botox- 100 units x 2 vials Lot: [id number] Expiration: 12/2022 NDC: 2233-6122-44  Bacteriostatic 0.9% Sodium Chloride- 43mL total Lot: [id number] Expiration: 12/06/2021 NDC: 5110-2111-73  Dx: V67.014 SAMPLES TODAY

## 2020-11-23 NOTE — Telephone Encounter (Signed)
I called [facility] [phone]) and spoke with Siniah to check status of Botox order. She states they have been trying to call the patient for consent but have not reached her, so they have placed the order on hold. The patient has a co-pay of $200.00.

## 2021-01-12 ENCOUNTER — Other Ambulatory Visit: Payer: Self-pay | Admitting: Neurology

## 2021-02-09 ENCOUNTER — Other Ambulatory Visit: Payer: Self-pay | Admitting: Neurology

## 2021-02-13 NOTE — Telephone Encounter (Signed)
Patient has a Botox appointment tomorrow. I never received her Botox and did not hear back from patient. I called [facility] [phone]) and spoke with Julio Alm. She states that they have still not been able to contact the patient. After our call, I attempted to call the patient but was not able to reach her. I LVM asking her to call the pharmacy.

## 2021-02-14 ENCOUNTER — Ambulatory Visit: Payer: Self-pay | Admitting: Neurology

## 2021-02-14 NOTE — Telephone Encounter (Signed)
I called the pharmacy again this morning to see if patient gave consent for Botox shipment yesterday. I spoke with Lelon Mast who states patient did not call. I then called the patient and LVM, advising that we have cancelled her appointment today due to not receiving her medication. Advised that if she wishes to reschedule, she should call the specialty pharmacy and give consent for shipment, and then call me back to r/s.

## 2021-03-13 ENCOUNTER — Other Ambulatory Visit: Payer: Self-pay | Admitting: Neurology

## 2021-03-16 NOTE — Telephone Encounter (Signed)
Received (2) 100 unit vials of Botox today from [facility]. Patient does not currently have an appointment.

## 2021-03-16 NOTE — Telephone Encounter (Signed)
Faxed PA request for Botox to [facility] with Dr. Lucia Gaskins as the requesting provider and Amy as the servicing provider.

## 2021-03-16 NOTE — Telephone Encounter (Signed)
Called patient and got her scheduled for Monday, 5/16 with Amy at 3:00.

## 2021-03-16 NOTE — Telephone Encounter (Signed)
Absolutely! You can use [employer] too

## 2021-03-20 ENCOUNTER — Ambulatory Visit (INDEPENDENT_AMBULATORY_CARE_PROVIDER_SITE_OTHER): Payer: 59 | Admitting: Family Medicine

## 2021-03-20 ENCOUNTER — Other Ambulatory Visit: Payer: Self-pay

## 2021-03-20 ENCOUNTER — Encounter: Payer: Self-pay | Admitting: Family Medicine

## 2021-03-20 DIAGNOSIS — G43109 Migraine with aura, not intractable, without status migrainosus: Secondary | ICD-10-CM | POA: Diagnosis not present

## 2021-03-20 NOTE — Progress Notes (Addendum)
03/20/21 ALL: She continues to do well on Botox. Rarely has migraines until Botox wears off. Rizatriptan helps with abortive therapy. She is having significant left sided neck pain. She has tried PT, dry needling, chiropractic care, orthopedic eval and nothing helps. She request frontalis injections high, no procerus or corrugator injections, no masseter injections, today, but has helped in past. 5 units in orb oculi help with retro orbital migraines.     11/15/2020 AA: doing very well.  Patient's baseline was daily headaches as well as daily migraines. She only gets some headaches in the time running up to her next botox and may have a few migraines a week prior when botox wears off. But still > 75% improvement in migraine frequency. Did the frontalis high up to avoid changing her eye brows at all but didn't work so this time I put 3 units above left brow and 1 unit above the right. Next time she can tell me if she had an asysmmtery of the either brow and we can modify to try and avoid "spock brow). +2unots each masseter. +5 each orb oculi. Put 15 extra units left paraspinal and left trap due to tightness. 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 She had microneedling with [place] about a month ago, she is very happy with it.    Consent Form Botulism Toxin Injection For Chronic Migraine    Reviewed orally with patient, additionally signature is on file:  Botulism toxin has been approved by the Federal drug administration for treatment of chronic migraine. Botulism toxin does not cure chronic migraine and it may not be effective in some patients.  The administration of botulism toxin is accomplished by injecting a small amount of toxin into the muscles of the neck and head. Dosage must be titrated for each individual. Any benefits resulting from botulism toxin tend to wear off after 3 months with a repeat injection required if benefit is to  be maintained. Injections are usually done every 3-4 months with maximum effect peak achieved by about 2 or 3 weeks. Botulism toxin is expensive and you should be sure of what costs you will incur resulting from the injection.  The side effects of botulism toxin use for chronic migraine may include:   -Transient, and usually mild, facial weakness with facial injections  -Transient, and usually mild, head or neck weakness with head/neck injections  -Reduction or loss of forehead facial animation due to forehead muscle weakness  -Eyelid drooping  -Dry eye  -Pain at the site of injection or bruising at the site of injection  -Double vision  -Potential unknown long term risks   Contraindications: You should not have Botox if you are pregnant, nursing, allergic to albumin, have an infection, skin condition, or muscle weakness at the site of the injection, or have myasthenia gravis, Lambert-Eaton syndrome, or ALS.  It is also possible that as with any injection, there may be an allergic reaction or no effect from the medication. Reduced effectiveness after repeated injections is sometimes seen and rarely infection at the injection site may occur. All care will be taken to prevent these side effects. If therapy is given over a long time, atrophy and wasting in the muscle injected may occur. Occasionally the patient's become refractory to treatment because they develop antibodies to the toxin. In this event, therapy needs to be modified.  I have read the above information and consent to the  administration of botulism toxin.    BOTOX PROCEDURE NOTE FOR MIGRAINE HEADACHE  Contraindications and precautions discussed with patient(above). Aseptic procedure was observed and patient tolerated procedure. Procedure performed by Shawnie Dapper, FNP-C.   The condition has existed for more than 6 months, and pt does not have a diagnosis of ALS, Myasthenia Gravis or Lambert-Eaton Syndrome.  Risks and benefits of  injections discussed and pt agrees to proceed with the procedure.  Written consent obtained  These injections are medically necessary. Pt  receives good benefits from these injections. These injections do not cause sedations or hallucinations which the oral therapies may cause.   Description of procedure:  The patient was placed in a sitting position. The standard protocol was used for Botox as follows, with 5 units of Botox injected at each site:  -Procerus muscle, not performed per patient request   -Corrugator muscle, not performed per patient request  -Frontalis muscle, bilateral injection, with 2 sites each side, medial injection was performed in the upper one third of the frontalis muscle, in the region vertical from the medial inferior edge of the superior orbital rim. The lateral injection was again in the upper one third of the forehead vertically above the lateral limbus of the cornea, 1.5 cm lateral to the medial injection site.  -Temporalis muscle injection, 4 sites, bilaterally. The first injection was 3 cm above the tragus of the ear, second injection site was 1.5 cm to 3 cm up from the first injection site in line with the tragus of the ear. The third injection site was 1.5-3 cm forward between the first 2 injection sites. The fourth injection site was 1.5 cm posterior to the second injection site. 5th site laterally in the temporalis  muscleat the level of the outer canthus.  -Occipitalis muscle injection, 3 sites, bilaterally. The first injection was done one half way between the occipital protuberance and the tip of the mastoid process behind the ear. The second injection site was done lateral and superior to the first, 1 fingerbreadth from the first injection. The third injection site was 1 fingerbreadth superiorly and medially from the first injection site.  -Cervical paraspinal muscle injection, 2 sites, bilaterally. The first injection site was 1 cm from the midline of the  cervical spine, 3 cm inferior to the lower border of the occipital protuberance. The second injection site was 1.5 cm superiorly and laterally to the first injection site.  -Trapezius muscle injection was performed at 3 sites, bilaterally. The first injection site was in the upper trapezius muscle halfway between the inflection point of the neck, and the acromion. The second injection site was one half way between the acromion and the first injection site. The third injection was done between the first injection site and the inflection point of the neck.  -LS bilaterally, 5 units on right and 15 units on left side.     Will return for repeat injection in 3 months.   A total of 200 units of Botox was prepared, 170 units of Botox was injected as documented above, any Botox not injected was wasted. The patient tolerated the procedure well, there were no complications of the above procedure.  Agree with procedure. Dr. Lucia Gaskins

## 2021-03-20 NOTE — Progress Notes (Signed)
Botox- 100 units x 2 vials Lot: [id number] Expiration: 052024 NDC: 0023-1145-01  Bacteriostatic 0.9% Sodium Chloride- 4mL total Lot: [id number] Expiration: 04/05/2022 NDC: 0409-1966-02  Dx: G43.711 S/P  

## 2021-03-21 NOTE — Telephone Encounter (Signed)
Late entry: Received approval for 61443 (injection) with Amy for Botox. [id number] is approved through the pharmacy benefit as we received her Botox from [facility]. PA #[id number] (03/16/21- 06/15/21).

## 2021-04-17 ENCOUNTER — Other Ambulatory Visit: Payer: Self-pay | Admitting: Neurology

## 2021-05-17 ENCOUNTER — Other Ambulatory Visit: Payer: Self-pay | Admitting: Neurology

## 2021-06-12 ENCOUNTER — Other Ambulatory Visit: Payer: Self-pay | Admitting: Neurology

## 2021-06-14 ENCOUNTER — Telehealth: Payer: Self-pay | Admitting: Neurology

## 2021-06-14 NOTE — Telephone Encounter (Signed)
Patient has a Botox appointment 8/16. I called [facility] @ [phone] and spoke with Rinaldo Cloud to check order status. She states patient has not returned their calls to provide consent for shipping.   Patient has pharmacy PA on file for [phone] through [employer]. PA reference #[id number] (11/18/20- 11/17/21). PA for [id number] w/ [facility] [employer] will expire tomorrow. I have completed and faxed PA form to plan to request authorization.

## 2021-06-15 NOTE — Telephone Encounter (Signed)
I called patient and LVM to remind her to call the specialty pharmacy to give consent for Botox shipment.

## 2021-06-19 NOTE — Telephone Encounter (Addendum)
Received approval from [facility]. PA #[id number] (06/15/21- 09/15/21).  I called patient again this morning and LVM regarding consent for Botox shipment/specialty pharmacy. Advised patient in voicemail that if I don't hear from her by the end of the day today, we will have to cancel her Botox appointment tomorrow due to not having the medication. LVM x2 and sent patient MyChart message.

## 2021-06-19 NOTE — Telephone Encounter (Signed)
Botox TBD 8/17. We will use office stock for patient's appointment tomorrow and replace it once medication arrives from pharmacy.

## 2021-06-20 ENCOUNTER — Encounter: Payer: Self-pay | Admitting: Neurology

## 2021-06-20 ENCOUNTER — Ambulatory Visit: Payer: 59 | Admitting: Family Medicine

## 2021-06-20 ENCOUNTER — Ambulatory Visit (INDEPENDENT_AMBULATORY_CARE_PROVIDER_SITE_OTHER): Payer: 59 | Admitting: Neurology

## 2021-06-20 DIAGNOSIS — Z5329 Procedure and treatment not carried out because of patient's decision for other reasons: Secondary | ICD-10-CM

## 2021-06-20 NOTE — Progress Notes (Signed)
No showed appointment in neurology.

## 2021-06-21 NOTE — Telephone Encounter (Signed)
Patient no-showed Botox appointment on 8/16. Received Botox shipment from specialty pharmacy today. Dr. Lucia Gaskins is requesting dismissal from practice due to patient no-shows.

## 2021-07-09 ENCOUNTER — Other Ambulatory Visit: Payer: Self-pay | Admitting: Neurology

## 2021-07-09 DIAGNOSIS — G43711 Chronic migraine without aura, intractable, with status migrainosus: Secondary | ICD-10-CM

## 2021-07-28 ENCOUNTER — Encounter (HOSPITAL_BASED_OUTPATIENT_CLINIC_OR_DEPARTMENT_OTHER): Payer: Self-pay | Admitting: Emergency Medicine

## 2021-07-28 ENCOUNTER — Emergency Department (HOSPITAL_BASED_OUTPATIENT_CLINIC_OR_DEPARTMENT_OTHER)
Admission: EM | Admit: 2021-07-28 | Discharge: 2021-07-28 | Disposition: A | Discharge status: Home / Self Care | Payer: 59 | Attending: Emergency Medicine | Admitting: Emergency Medicine

## 2021-07-28 ENCOUNTER — Other Ambulatory Visit: Payer: Self-pay

## 2021-07-28 DIAGNOSIS — Y990 Civilian activity done for income or pay: Secondary | ICD-10-CM | POA: Diagnosis not present

## 2021-07-28 DIAGNOSIS — Y93C2 Activity, hand held interactive electronic device: Secondary | ICD-10-CM | POA: Insufficient documentation

## 2021-07-28 DIAGNOSIS — W25XXXA Contact with sharp glass, initial encounter: Secondary | ICD-10-CM | POA: Diagnosis not present

## 2021-07-28 DIAGNOSIS — R000 Tachycardia, unspecified: Secondary | ICD-10-CM | POA: Diagnosis not present

## 2021-07-28 DIAGNOSIS — Z23 Encounter for immunization: Secondary | ICD-10-CM | POA: Diagnosis not present

## 2021-07-28 DIAGNOSIS — S61212A Laceration without foreign body of right middle finger without damage to nail, initial encounter: Secondary | ICD-10-CM | POA: Insufficient documentation

## 2021-07-28 DIAGNOSIS — S6991XA Unspecified injury of right wrist, hand and finger(s), initial encounter: Secondary | ICD-10-CM | POA: Diagnosis present

## 2021-07-28 MED ORDER — LIDOCAINE HCL 2 % IJ SOLN
10.0000 mL | Freq: Once | INTRAMUSCULAR | Status: AC
  Administered 2021-07-28: 200 mg
  Filled 2021-07-28: qty 20

## 2021-07-28 MED ORDER — TETANUS-DIPHTH-ACELL PERTUSSIS 5-2.5-18.5 LF-MCG/0.5 IM SUSY
0.5000 mL | PREFILLED_SYRINGE | Freq: Once | INTRAMUSCULAR | Status: AC
  Administered 2021-07-28: 0.5 mL via INTRAMUSCULAR
  Filled 2021-07-28: qty 0.5

## 2021-07-28 NOTE — ED Triage Notes (Signed)
Pt states she cut her right middle finger on a glass tonight

## 2021-07-28 NOTE — Discharge Instructions (Addendum)
Your sutures will need removed in 7-10 days.  You can have these removed at Urgent Care, your Family Doctor or the Emergency Department. Keep the wound clean and dry. Get rechecked if you develop increased swelling, drainage, pain or new concerning symptoms.

## 2021-07-28 NOTE — ED Provider Notes (Signed)
[facility] EMERGENCY DEPARTMENT Provider Note   CSN: [id number] Arrival date & time: 07/28/21  0246     History Chief Complaint  Patient presents with   Laceration    Wendy Richards is a 30 y.o. female.  The history is provided by the patient and the spouse.  Laceration Wendy Richards is a 30 y.o. female who presents to the Emergency Department complaining of finger laceration. She presents the emergency department accompanied by her husband for evaluation of laceration to the right hand. She was playing a Chief Executive Officer game when she did a karate chop with her hand into a glass. She broke the glass and lacerated her right hand. She is right-hand dominant. She works as a Emergency planning/management officer. She has local pain to the wound, no additional injuries. Last tetanus is unknown. She has a history of IC, no additional medical issues. Denies chance of pregnancy.    Past Medical History:  Diagnosis Date   Hyperthyroidism    Interstitial cystitis    Interstitial cystitis    Panic attacks    SOB (shortness of breath)    Tachycardia     Patient Active Problem List   Diagnosis Date Noted   Migraine with aura and without status migrainosus, not intractable 01/06/2016   Tachycardia 10/15/2012   Palpitations 10/15/2012   Panic anxiety syndrome 10/15/2012    Past Surgical History:  Procedure Laterality Date   BREAST ENHANCEMENT SURGERY     EXTERNAL EAR SURGERY     RHINOPLASTY       OB History     Gravida  0   Para  0   Term      Preterm      AB      Living  0      SAB      IAB      Ectopic      Multiple      Live Births              Family History  Problem Relation Age of Onset   Ovarian cysts Mother    Migraines Mother    Hypertension Maternal Grandfather    Cancer Maternal Grandfather        died of non hodgkins lymphoma   Heart attack Paternal Grandfather    Cancer Maternal Grandmother        uterine cancer   Migraines Brother      Social History   Tobacco Use   Smoking status: Never   Smokeless tobacco: Never  Vaping Use   Vaping Use: Never used  Substance Use Topics   Alcohol use: Yes    Comment: maybe a couple times a month   Drug use: No    Home Medications Prior to Admission medications   Medication Sig Start Date End Date Taking? Authorizing Provider  amphetamine-dextroamphetamine (ADDERALL XR) 30 MG 24 hr capsule Take by mouth.    [provider]  botulinum toxin Type A (BOTOX) 100 units SOLR injection INJECT 155 UNITS INTRAMUSCULARLY IN HEAD AND NECK EVERY 3 MONTHS (DISCARD REMAINDER) 07/10/21   Anson Fret, MD  cyanocobalamin (,VITAMIN B-12,) 1000 MCG/ML injection INJECT 1 ML INTO THE MUSCLE ONCE A WEEK. 06/12/21   Anson Fret, MD  rizatriptan (MAXALT-MLT) 10 MG disintegrating tablet Take 1 tablet (10 mg total) by mouth as needed for migraine. May repeat in 2 hours if needed 01/05/20   Anson Fret, MD    Allergies  Benadryl [diphenhydramine hcl (sleep)]  Review of Systems   Review of Systems  All other systems reviewed and are negative.  Physical Exam Updated Vital Signs BP 138/82 (BP Location: Left Arm)   Pulse (!) 137   Temp 98.4 F (36.9 C) (Oral)   Resp 18   Ht 5\' 6"  (1.676 m)   Wt 59 kg   LMP 07/07/2021 (Approximate)   SpO2 100%   BMI 20.98 kg/m   Physical Exam Vitals and nursing note reviewed.  Constitutional:      Appearance: She is well-developed.  HENT:     Head: Normocephalic and atraumatic.  Cardiovascular:     Rate and Rhythm: Regular rhythm. Tachycardia present.  Pulmonary:     Effort: Pulmonary effort is normal. No respiratory distress.  Musculoskeletal:     Comments: 2+ right radial pulse. There is a laceration over the ulnar aspect of the right third digit near the DIP joint and middle phalanx. Flexion and extension are intact distally. Sensation to light touch is intact distally.  Skin:    General: Skin is warm and dry.  Neurological:      Mental Status: She is alert and oriented to person, place, and time.  Psychiatric:        Behavior: Behavior normal.    ED Results / Procedures / Treatments   Labs (all labs ordered are listed, but only abnormal results are displayed) Labs Reviewed - No data to display  EKG None  Radiology No results found.  Procedures .11/02/2022Laceration Repair  Date/Time: 07/28/2021 5:06 AM Performed by: 07/30/2021, MD Authorized by: Tilden Fossa, MD   Consent:    Consent obtained:  Verbal   Consent given by:  Patient   Risks discussed:  Pain, poor cosmetic result and poor wound healing Universal protocol:    Patient identity confirmed:  Verbally with patient Anesthesia:    Anesthesia method:  Nerve block   Block location:  Digital, base of middle finger   Block needle gauge:  25 G   Block anesthetic:  Lidocaine 2% w/o epi   Block technique:  Dorsal block at base of third digit   Block injection procedure:  Anatomic landmarks identified, introduced needle, incremental injection, negative aspiration for blood and anatomic landmarks palpated   Block outcome:  Anesthesia achieved Laceration details:    Location:  Finger   Finger location:  R long finger   Length (cm):  3 Pre-procedure details:    Preparation:  Patient was prepped and draped in usual sterile fashion Exploration:    Hemostasis achieved with:  Tourniquet   Imaging outcome: foreign body not noted     Wound exploration: wound explored through full range of motion and entire depth of wound visualized     Contaminated: no   Treatment:    Area cleansed with:  Povidone-iodine and saline   Amount of cleaning:  Standard   Debridement:  None Skin repair:    Repair method:  Sutures   Suture size:  4-0   Suture material:  Prolene   Suture technique:  Simple interrupted   Number of sutures:  5 Approximation:    Approximation:  Close Repair type:    Repair type:  Intermediate Post-procedure details:    Dressing:   Antibiotic ointment and non-adherent dressing   Procedure completion:  Tolerated   Medications Ordered in ED Medications  Tdap (BOOSTRIX) injection 0.5 mL (has no administration in time range)  lidocaine (XYLOCAINE) 2 % (with pres) injection 200 mg (has no  administration in time range)    ED Course  I have reviewed the triage vital signs and the nursing notes.  Pertinent labs & imaging results that were available during my care of the patient were reviewed by me and considered in my medical decision making (see chart for details).    MDM Rules/Calculators/A&P                          patient here for evaluation of right third digit laceration. No evidence of tendon involvement or foreign body. Wound repaired per procedure note. Discussed wound care and return precautions.  Final Clinical Impression(s) / ED Diagnoses Final diagnoses:  None    Rx / DC Orders ED Discharge Orders     None        Tilden Fossa, MD 07/28/21 [phone]

## 2021-09-26 ENCOUNTER — Ambulatory Visit: Payer: 59 | Admitting: Neurology

## 2021-10-31 ENCOUNTER — Other Ambulatory Visit: Payer: Self-pay | Admitting: Neurology

## 2021-12-14 ENCOUNTER — Encounter: Payer: Medicaid Other | Admitting: Family Medicine

## 2022-04-19 ENCOUNTER — Encounter: Payer: Self-pay | Admitting: Radiology

## 2022-04-19 ENCOUNTER — Ambulatory Visit (INDEPENDENT_AMBULATORY_CARE_PROVIDER_SITE_OTHER): Payer: 59 | Admitting: Radiology

## 2022-04-19 ENCOUNTER — Other Ambulatory Visit (HOSPITAL_COMMUNITY)
Admission: RE | Admit: 2022-04-19 | Discharge: 2022-04-19 | Disposition: A | Discharge status: Home / Self Care | Payer: Medicaid Other | Source: Ambulatory Visit | Attending: Radiology | Admitting: Radiology

## 2022-04-19 VITALS — BP 102/68 | Ht 67.5 in | Wt 137.0 lb

## 2022-04-19 DIAGNOSIS — E079 Disorder of thyroid, unspecified: Secondary | ICD-10-CM

## 2022-04-19 DIAGNOSIS — Z01419 Encounter for gynecological examination (general) (routine) without abnormal findings: Secondary | ICD-10-CM

## 2022-04-19 DIAGNOSIS — R002 Palpitations: Secondary | ICD-10-CM

## 2022-04-19 DIAGNOSIS — N941 Unspecified dyspareunia: Secondary | ICD-10-CM

## 2022-04-19 DIAGNOSIS — Z30011 Encounter for initial prescription of contraceptive pills: Secondary | ICD-10-CM

## 2022-04-19 DIAGNOSIS — Z113 Encounter for screening for infections with a predominantly sexual mode of transmission: Secondary | ICD-10-CM

## 2022-04-19 DIAGNOSIS — Z8639 Personal history of other endocrine, nutritional and metabolic disease: Secondary | ICD-10-CM

## 2022-04-19 MED ORDER — SLYND 4 MG PO TABS: 1.0000 | ORAL_TABLET | Freq: Every day | ORAL | 4 refills | Status: DC

## 2022-04-19 NOTE — Progress Notes (Signed)
[name] 15-Nov-1990 [id number]   History:  31 y.o. G1P0 presents for annual exam. C/o palpitations, hot flashes, fatigue. Hx of thyroid dysfunction and b12/D deficiency. Reports heavy, painful cycles. Mood swings and PMS before her period. Interested in something that will help without estrogen, has tried multiple BC methods in the past and she became depressed. Zoloft also made her more depressed.   Gynecologic History Patient's last menstrual period was 04/10/2022. Period Cycle (Days): 28 Period Duration (Days): 4 Period Pattern: Regular Menstrual Flow: Heavy, Light (passes clots with light flow) Dysmenorrhea: (!) Moderate Dysmenorrhea Symptoms: Cramping Contraception/Family planning: none Sexually active: yes Last Pap: 2020. Results were: normal, history of abnormals   Obstetric History OB History  Gravida Para Term Preterm AB Living  1 0     1 0  SAB IAB Ectopic Multiple Live Births  1            # Outcome Date GA Lbr Len/2nd Weight Sex Delivery Anes PTL Lv  1 SAB              The following portions of the patient's history were reviewed and updated as appropriate: allergies, past family history, past medical history, past social history, past surgical history, and problem list.  Review of Systems Pertinent items noted in HPI and remainder of comprehensive ROS otherwise negative.   Past medical history, past surgical history, family history and social history were all reviewed and documented in the EPIC chart.   Exam:  Vitals:   04/19/22 1451  BP: 102/68  Weight: 137 lb (62.1 kg)  Height: 5' 7.5" (1.715 m)   Body mass index is 21.14 kg/m.  General appearance:  Normal Thyroid:  Symmetrical, normal in size, without palpable masses or nodularity. Respiratory  Auscultation:  Clear without wheezing or rhonchi Cardiovascular  Auscultation:  Regular rate, without rubs, murmurs or gallops  Edema/varicosities:  Not grossly  evident Abdominal  Soft,nontender, without masses, guarding or rebound.  Liver/spleen:  No organomegaly noted  Hernia:  None appreciated  Skin  Inspection:  Grossly normal Breasts: Examined lying and sitting. Silicone implants bilaterally x 12 years  Right: Without masses, retractions, nipple discharge or axillary adenopathy.   Left: Without masses, retractions, nipple discharge or axillary adenopathy. Genitourinary   Inguinal/mons:  Normal without inguinal adenopathy  External genitalia:  Normal appearing vulva with no masses, tenderness, or lesions  BUS/Urethra/Skene's glands:  Normal without masses or exudate  Vagina:  Normal appearing with normal color and discharge, no lesions  Cervix:  Normal appearing without discharge or lesions  Uterus:  Normal in size, shape and contour.  Mobile, nontender  Adnexa/parametria:     Rt: Normal in size, without masses or tenderness.   Lt: Normal in size, without masses or tenderness.  Anus and perineum: Normal   Patient informed chaperone available to be present for breast and pelvic exam. Patient has requested no chaperone to be present. Patient has been advised what will be completed during breast and pelvic exam.   Assessment/Plan:   1. Well woman exam with routine gynecological exam  - Cytology - PAP( [place])  2. Screen for sexually transmitted diseases Done with Pap   3. Dysfunction of thyroid History of hyper and hypo - Thyroid Panel With TSH  4. H/O non anemic vitamin B12 deficiency Ran out of B12 rx last month - B12 and Folate Panel  5. H/O vitamin D deficiency  - Vitamin D (25 hydroxy)  6. Palpitations  - CBC  7. Dyspareunia in female Will begin slynd, did not feel well using estrogen containing methods  8. Encounter for initial prescription of contraceptive pills  - Drospirenone (SLYND) 4 MG TABS; Take 1 tablet by mouth daily.  Dispense: 84 tablet; Refill: 4     Discussed SBE, colonoscopy and DEXA  screening as directed/appropriate. Recommend of exercise weekly, including weight bearing exercise. Encouraged the use of seatbelts and sunscreen. Return in 1 year for annual or as needed.   Arlie Solomons B WHNP-BC 3:21 PM 04/19/2022

## 2022-04-20 ENCOUNTER — Telehealth: Payer: Self-pay

## 2022-04-20 ENCOUNTER — Other Ambulatory Visit: Payer: Self-pay | Admitting: Radiology

## 2022-04-20 DIAGNOSIS — E559 Vitamin D deficiency, unspecified: Secondary | ICD-10-CM

## 2022-04-20 DIAGNOSIS — R790 Abnormal level of blood mineral: Secondary | ICD-10-CM

## 2022-04-20 DIAGNOSIS — D696 Thrombocytopenia, unspecified: Secondary | ICD-10-CM

## 2022-04-20 LAB — THYROID PANEL WITH TSH
Free Thyroxine Index: 2.6 (ref 1.4–3.8)
T3 Uptake: 34 % (ref 22–35)
T4, Total: 7.6 ug/dL (ref 5.1–11.9)
TSH: 0.68 mIU/L

## 2022-04-20 LAB — CBC
HCT: 43.5 % (ref 35.0–45.0)
Hemoglobin: 14.8 g/dL (ref 11.7–15.5)
MCH: 31.3 pg (ref 27.0–33.0)
MCHC: 34 g/dL (ref 32.0–36.0)
MCV: 92 fL (ref 80.0–100.0)
MPV: 9.4 fL (ref 7.5–12.5)
Platelets: 417 10*3/uL — ABNORMAL HIGH (ref 140–400)
RBC: 4.73 10*6/uL (ref 3.80–5.10)
RDW: 11.7 % (ref 11.0–15.0)
WBC: 8.7 10*3/uL (ref 3.8–10.8)

## 2022-04-20 LAB — B12 AND FOLATE PANEL
Folate: 4.4 ng/mL — ABNORMAL LOW
Vitamin B-12: 331 pg/mL (ref 200–1100)

## 2022-04-20 LAB — VITAMIN D 25 HYDROXY (VIT D DEFICIENCY, FRACTURES): Vit D, 25-Hydroxy: 15 ng/mL — ABNORMAL LOW (ref 30–100)

## 2022-04-20 MED ORDER — ERGOCALCIFEROL 1.25 MG (50000 UT) PO CAPS: 50000.0000 [IU] | ORAL_CAPSULE | ORAL | 0 refills | Status: AC

## 2022-04-20 NOTE — Telephone Encounter (Signed)
Tanda Rockers, NP  [facility] Triage Refer to hematology for thrombocytopenia and low ferritin with normal H+H  Referral Sent.

## 2022-04-24 ENCOUNTER — Telehealth: Payer: Self-pay | Admitting: Hematology and Oncology

## 2022-04-24 LAB — CYTOLOGY - PAP
Chlamydia: NEGATIVE
Comment: NEGATIVE
Comment: NEGATIVE
Comment: NEGATIVE
Comment: NEGATIVE
Comment: NEGATIVE
Comment: NORMAL
Diagnosis: UNDETERMINED — AB
HPV 16: NEGATIVE
HPV 18 / 45: NEGATIVE
High risk HPV: POSITIVE — AB
Neisseria Gonorrhea: NEGATIVE
Trichomonas: NEGATIVE

## 2022-04-24 NOTE — Telephone Encounter (Signed)
Scheduled appt per 6/16 referral. Pt is aware of appt date and time. Pt is aware to arrive 15 mins prior to appt time and to bring and updated insurance card. Pt is aware of appt location.   

## 2022-04-25 ENCOUNTER — Other Ambulatory Visit: Payer: Self-pay

## 2022-04-25 DIAGNOSIS — R87610 Atypical squamous cells of undetermined significance on cytologic smear of cervix (ASC-US): Secondary | ICD-10-CM

## 2022-04-25 NOTE — Telephone Encounter (Signed)
FYI. Scheduled for 05/16/22.

## 2022-04-26 NOTE — Progress Notes (Signed)
GYNECOLOGY  VISIT   HPI: 31 y.o.   Single White or Caucasian Not Hispanic or Latino  female   [id number] with Patient's last menstrual period was 04/10/2022.   here for colposcopy due to ACSUS, HR HPV+ on 04/19/22.   She had an abnormal pap in 2015, didn't have a colposcopy. Was treated with the gardasil. F/U pap was normal.    Pt reports she forgot to tell physician about some sxs she has been experiencing since being told about recent blood work being done and has been referred to Hematologists. Reports these sxs have been going on for longer but forgot to mention sxs of as if circulation is being cut off in arms and legs at times.   GYNECOLOGIC HISTORY: Patient's last menstrual period was 04/10/2022. Contraception: n/a Menopausal hormone therapy: n/a        OB History     Gravida  1   Para  0   Term      Preterm      AB  1   Living  0      SAB  1   IAB      Ectopic      Multiple      Live Births                 Patient Active Problem List   Diagnosis Date Noted   Migraine with aura and without status migrainosus, not intractable 01/06/2016   Tachycardia 10/15/2012   Palpitations 10/15/2012   Panic anxiety syndrome 10/15/2012    Past Medical History:  Diagnosis Date   Hyperthyroidism    Interstitial cystitis    Interstitial cystitis    Panic attacks    SOB (shortness of breath)    Tachycardia     Past Surgical History:  Procedure Laterality Date   BREAST ENHANCEMENT SURGERY     EXTERNAL EAR SURGERY     RHINOPLASTY      Current Outpatient Medications  Medication Sig Dispense Refill   amphetamine-dextroamphetamine (ADDERALL XR) 30 MG 24 hr capsule Take by mouth.     botulinum toxin Type A (BOTOX) 100 units SOLR injection INJECT 155 UNITS INTRAMUSCULARLY IN HEAD AND NECK EVERY 3 MONTHS (DISCARD REMAINDER) (Patient not taking: Reported on 04/19/2022) 2 each 2   cyanocobalamin (,VITAMIN B-12,) 1000 MCG/ML injection INJECT 1 ML INTO THE MUSCLE ONCE  A WEEK. (Patient not taking: Reported on 04/19/2022) 4 mL 0   Drospirenone (SLYND) 4 MG TABS Take 1 tablet by mouth daily. 84 tablet 4   ergocalciferol (VITAMIN D2) 1.25 MG (50000 UT) capsule Take 1 capsule (50,000 Units total) by mouth once a week. 12 capsule 0   rizatriptan (MAXALT-MLT) 10 MG disintegrating tablet Take 1 tablet (10 mg total) by mouth as needed for migraine. May repeat in 2 hours if needed (Patient not taking: Reported on 04/19/2022) 9 tablet 11   No current facility-administered medications for this visit.     ALLERGIES: Benadryl [diphenhydramine hcl (sleep)]  Family History  Problem Relation Age of Onset   Ovarian cysts Mother    Migraines Mother    Hypertension Maternal Grandfather    Cancer Maternal Grandfather        died of non hodgkins lymphoma   Heart attack Paternal Grandfather    Cancer Maternal Grandmother        uterine cancer   Migraines Brother     Social History   Socioeconomic History   Marital status: Single    Spouse name:  Not on file   Number of children: 0   Years of education: 14   Highest education level: Not on file  Occupational History   Occupation: Geophysical data processor  Tobacco Use   Smoking status: Some Days    Types: E-cigarettes   Smokeless tobacco: Never   Tobacco comments:    VAPES  Vaping Use   Vaping Use: Never used  Substance and Sexual Activity   Alcohol use: Yes    Comment: maybe a couple times a month   Drug use: Yes    Types: Marijuana    Comment: occ   Sexual activity: Yes    Partners: Male    Birth control/protection: None  Other Topics Concern   Not on file  Social History Narrative   Pt lives in [place] with her mother.   Fulltime employee at [employer] at a [employer] handed   Caffeine: in the morning, 2 cups    Social Determinants of Health   Financial Resource Strain: Not on file  Food Insecurity: Not on file  Transportation Needs: Not on file  Physical Activity: Not on  file  Stress: Not on file  Social Connections: Not on file  Intimate Partner Violence: Not on file    Review of Systems  All other systems reviewed and are negative.   PHYSICAL EXAMINATION:    LMP 04/10/2022     General appearance: alert, cooperative and appears stated age   Pelvic: External genitalia:  no lesions              Urethra:  normal appearing urethra with no masses, tenderness or lesions              Bartholins and Skenes: normal                 Vagina: normal appearing vagina with normal color and discharge, no lesions              Cervix:  no gross lesions  Colposcopy: unsatisfactory, mild aceto-white changes. Biopsies taken at 6 and 12 o'clock, ECC done. Silver nitrate and monsels needed for hemostasis. She got light headed with use of silver nitrate. Stopped the procedure then restarted after a few minute break. Negative lugols examination of the upper vagina.               Chaperone was present for exam.  1. ASCUS with positive high risk HPV cervical - Colposcopy - Surgical pathology( [place]/ [facility])  2. Pre-procedural laboratory examination - Pregnancy, urine

## 2022-05-07 ENCOUNTER — Other Ambulatory Visit (HOSPITAL_COMMUNITY)
Admission: RE | Admit: 2022-05-07 | Discharge: 2022-05-07 | Disposition: A | Discharge status: Home / Self Care | Payer: Medicaid Other | Source: Ambulatory Visit | Attending: Obstetrics and Gynecology | Admitting: Obstetrics and Gynecology

## 2022-05-07 ENCOUNTER — Ambulatory Visit (INDEPENDENT_AMBULATORY_CARE_PROVIDER_SITE_OTHER): Payer: 59 | Admitting: Obstetrics and Gynecology

## 2022-05-07 ENCOUNTER — Encounter: Payer: Self-pay | Admitting: Obstetrics and Gynecology

## 2022-05-07 VITALS — BP 102/68 | HR 68

## 2022-05-07 DIAGNOSIS — R87610 Atypical squamous cells of undetermined significance on cytologic smear of cervix (ASC-US): Secondary | ICD-10-CM | POA: Diagnosis not present

## 2022-05-07 DIAGNOSIS — Z01812 Encounter for preprocedural laboratory examination: Secondary | ICD-10-CM

## 2022-05-07 DIAGNOSIS — R87810 Cervical high risk human papillomavirus (HPV) DNA test positive: Secondary | ICD-10-CM | POA: Diagnosis not present

## 2022-05-07 LAB — PREGNANCY, URINE: Preg Test, Ur: NEGATIVE

## 2022-05-07 NOTE — Patient Instructions (Signed)

## 2022-05-09 LAB — SURGICAL PATHOLOGY

## 2022-05-16 ENCOUNTER — Inpatient Hospital Stay: Payer: 59

## 2022-05-16 ENCOUNTER — Other Ambulatory Visit: Payer: Self-pay

## 2022-05-16 ENCOUNTER — Inpatient Hospital Stay: Payer: 59 | Attending: Hematology and Oncology | Admitting: Hematology and Oncology

## 2022-05-16 VITALS — BP 134/84 | HR 91 | Temp 98.4°F | Resp 15 | Wt 136.0 lb

## 2022-05-16 DIAGNOSIS — Z832 Family history of diseases of the blood and blood-forming organs and certain disorders involving the immune mechanism: Secondary | ICD-10-CM | POA: Diagnosis not present

## 2022-05-16 DIAGNOSIS — N920 Excessive and frequent menstruation with regular cycle: Secondary | ICD-10-CM | POA: Insufficient documentation

## 2022-05-16 DIAGNOSIS — E611 Iron deficiency: Secondary | ICD-10-CM | POA: Diagnosis present

## 2022-05-16 DIAGNOSIS — D75839 Thrombocytosis, unspecified: Secondary | ICD-10-CM | POA: Insufficient documentation

## 2022-05-16 DIAGNOSIS — Z807 Family history of other malignant neoplasms of lymphoid, hematopoietic and related tissues: Secondary | ICD-10-CM | POA: Insufficient documentation

## 2022-05-16 LAB — CBC WITH DIFFERENTIAL (CANCER CENTER ONLY)
Abs Immature Granulocytes: 0.02 10*3/uL (ref 0.00–0.07)
Basophils Absolute: 0.1 10*3/uL (ref 0.0–0.1)
Basophils Relative: 1 %
Eosinophils Absolute: 0 10*3/uL (ref 0.0–0.5)
Eosinophils Relative: 0 %
HCT: 43 % (ref 36.0–46.0)
Hemoglobin: 14.7 g/dL (ref 12.0–15.0)
Immature Granulocytes: 0 %
Lymphocytes Relative: 20 %
Lymphs Abs: 1.4 10*3/uL (ref 0.7–4.0)
MCH: 30.4 pg (ref 26.0–34.0)
MCHC: 34.2 g/dL (ref 30.0–36.0)
MCV: 89 fL (ref 80.0–100.0)
Monocytes Absolute: 0.5 10*3/uL (ref 0.1–1.0)
Monocytes Relative: 8 %
Neutro Abs: 4.9 10*3/uL (ref 1.7–7.7)
Neutrophils Relative %: 71 %
Platelet Count: 413 10*3/uL — ABNORMAL HIGH (ref 150–400)
RBC: 4.83 MIL/uL (ref 3.87–5.11)
RDW: 12.7 % (ref 11.5–15.5)
WBC Count: 6.9 10*3/uL (ref 4.0–10.5)
nRBC: 0 % (ref 0.0–0.2)

## 2022-05-16 LAB — RETIC PANEL
Immature Retic Fract: 20.3 % — ABNORMAL HIGH (ref 2.3–15.9)
RBC.: 4.82 MIL/uL (ref 3.87–5.11)
Retic Count, Absolute: 75.2 10*3/uL (ref 19.0–186.0)
Retic Ct Pct: 1.6 % (ref 0.4–3.1)
Reticulocyte Hemoglobin: 33 pg (ref >27.9)

## 2022-05-16 LAB — C-REACTIVE PROTEIN: CRP: 0.6 mg/dL (ref <1.0)

## 2022-05-16 LAB — FERRITIN: Ferritin: 11 ng/mL (ref 11–307)

## 2022-05-16 LAB — SEDIMENTATION RATE: Sed Rate: 18 mm/hr (ref 0–22)

## 2022-05-16 LAB — CMP (CANCER CENTER ONLY)
ALT: 11 U/L (ref 0–44)
AST: 21 U/L (ref 15–41)
Albumin: 4.1 g/dL (ref 3.5–5.0)
Alkaline Phosphatase: 74 U/L (ref 38–126)
Anion gap: 9 (ref 5–15)
BUN: 6 mg/dL (ref 6–20)
CO2: 23 mmol/L (ref 22–32)
Calcium: 9.4 mg/dL (ref 8.9–10.3)
Chloride: 105 mmol/L (ref 98–111)
Creatinine: 0.95 mg/dL (ref 0.44–1.00)
GFR, Estimated: >60 mL/min (ref >60)
Glucose, Bld: 85 mg/dL (ref 70–99)
Potassium: 3.8 mmol/L (ref 3.5–5.1)
Sodium: 137 mmol/L (ref 135–145)
Total Bilirubin: 0.7 mg/dL (ref 0.3–1.2)
Total Protein: 7.5 g/dL (ref 6.5–8.1)

## 2022-05-16 LAB — IRON AND IRON BINDING CAPACITY (CC-WL,HP ONLY)
Iron: 35 ug/dL (ref 28–170)
Saturation Ratios: 8 % — ABNORMAL LOW (ref 10.4–31.8)
TIBC: 419 ug/dL (ref 250–450)
UIBC: 384 ug/dL

## 2022-05-16 NOTE — Progress Notes (Signed)
[facility] [phone]   [phone]  INITIAL CONSULT NOTE  Patient Care Team: Patient, No Pcp Per as PCP - General (General Practice)  Hematological/Oncological History # Thrombocytosis 04/19/2022: WBC 8.7, Hgb 14.8, MCV 92, Plt 417 05/16/2022: establish care with Dr. Lorenso Courier   CHIEF COMPLAINTS/PURPOSE OF CONSULTATION:  "Thrombocytosis "  HISTORY OF PRESENTING ILLNESS:  Wendy Richards 31 y.o. female with medical history significant for hyperthyroidism, interstitial cystitis, tachycardia, and panic attacks who present for evaluation of low ferritin and thrombocytosis.   On review of the previous records Wendy Richards had labs drawn on 04/19/2022 at which time she was found to have white blood cell count 8.7, hemoglobin 14.8, MCV 92, and platelets of 417.  Due to concern for this finding the patient was referred to hematology for further evaluation and management.  On exam today Wendy Richards reports that she has never been on iron pills before in the past and has never had issues with iron deficiency.  She reports that she has been having trouble with feeling sluggish and "depressed feelings".  She reports that her menstrual cycles "just got heavy".  She reports they normally last about 4 to 5 days and occur on a regular monthly basis.  She notes that recently her second AVID.  Is been quite heavy with 6 soaked pads.  She notes that previously her periods were quite light and consisted of only spotting.  She is unsure why she had changes in her menstrual cycles.  She reports that she is not vegetarian or vegan and does not eating red meat at least once per week.  She notes he is not having any other overt signs of bleeding, bruising, or dark stools.  On further discussion the patient reports that her family history is remarkable for blood clots in her paternal grandmother and cervical cancer in her maternal grandmother.  She reports that her mother and father  are both quite healthy.  Her maternal grandfather had lymphoma.  She reports that she does not have any children.  She is currently vapes but is trying to quit.  She does not smoke any cigarettes but did smoke a few cigarettes when she was younger.  She reports that she has a glass of wine approximately nightly.  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  She otherwise denies any fevers, chills, sweats, nausea, ming or diarrhea.  A full 10 point ROS is listed below.  MEDICAL HISTORY:  Past Medical History:  Diagnosis Date   Hyperthyroidism    Interstitial cystitis    Interstitial cystitis    Panic attacks    SOB (shortness of breath)    Tachycardia     SURGICAL HISTORY: Past Surgical History:  Procedure Laterality Date   BREAST ENHANCEMENT SURGERY     EXTERNAL EAR SURGERY     RHINOPLASTY      SOCIAL HISTORY: Social History   Socioeconomic History   Marital status: Single    Spouse name: Not on file   Number of children: 0   Years of education: 14   Highest education level: Not on file  Occupational History   Occupation: Ship broker  Tobacco Use   Smoking status: Some Days    Types: E-cigarettes   Smokeless tobacco: Never   Tobacco comments:    VAPES  Vaping Use   Vaping Use: Never used  Substance and Sexual Activity   Alcohol use: Yes    Comment: maybe a couple times  a month   Drug use: Yes    Types: Marijuana    Comment: occ   Sexual activity: Yes    Partners: Male    Birth control/protection: None  Other Topics Concern   Not on file  Social History Narrative   Pt lives in [place] with her mother.   Fulltime employee at [employer] at a [employer] handed   Caffeine: in the morning, 2 cups    Social Determinants of Radio broadcast assistant Strain: Not on file  Food Insecurity: Not on file  Transportation Needs: Not on file  Physical Activity: Not on file  Stress: Not on file  Social Connections: Not on file   Intimate Partner Violence: Not on file    FAMILY HISTORY: Family History  Problem Relation Age of Onset   Ovarian cysts Mother    Migraines Mother    Hypertension Maternal Grandfather    Cancer Maternal Grandfather        died of non hodgkins lymphoma   Heart attack Paternal Grandfather    Cancer Maternal Grandmother        uterine cancer   Migraines Brother     ALLERGIES:  is allergic to benadryl [diphenhydramine hcl (sleep)].  MEDICATIONS:  Current Outpatient Medications  Medication Sig Dispense Refill   amphetamine-dextroamphetamine (ADDERALL XR) 30 MG 24 hr capsule Take by mouth.     ergocalciferol (VITAMIN D2) 1.25 MG (50000 UT) capsule Take 1 capsule (50,000 Units total) by mouth once a week. 12 capsule 0   No current facility-administered medications for this visit.    REVIEW OF SYSTEMS:   Constitutional: ( - ) fevers, ( - )  chills , ( - ) night sweats Eyes: ( - ) blurriness of vision, ( - ) double vision, ( - ) watery eyes Ears, nose, mouth, throat, and face: ( - ) mucositis, ( - ) sore throat Respiratory: ( - ) cough, ( - ) dyspnea, ( - ) wheezes Cardiovascular: ( - ) palpitation, ( - ) chest discomfort, ( - ) lower extremity swelling Gastrointestinal:  ( - ) nausea, ( - ) heartburn, ( - ) change in bowel habits Skin: ( - ) abnormal skin rashes Lymphatics: ( - ) new lymphadenopathy, ( - ) easy bruising Neurological: ( - ) numbness, ( - ) tingling, ( - ) new weaknesses Behavioral/Psych: ( - ) mood change, ( - ) new changes  All other systems were reviewed with the patient and are negative.  PHYSICAL EXAMINATION:  Vitals:   05/16/22 1258  BP: 134/84  Pulse: 91  Resp: 15  Temp: 98.4 F (36.9 C)   Filed Weights   05/16/22 1258  Weight: 136 lb (61.7 kg)    GENERAL: well appearing young Caucasian female in NAD  SKIN: skin color, texture, turgor are normal, no rashes or significant lesions EYES: conjunctiva are pink and non-injected, sclera clear LUNGS:  clear to auscultation and percussion with normal breathing effort HEART: regular rate & rhythm and no murmurs and no lower extremity edema Musculoskeletal: no cyanosis of digits and no clubbing  PSYCH: alert & oriented x 3, fluent speech NEURO: no focal motor/sensory deficits  LABORATORY DATA:  I have reviewed the data as listed    Latest Ref Rng & Units 05/16/2022    1:53 PM 04/19/2022    3:18 PM 09/06/2016    3:17 AM  CBC  WBC 4.0 - 10.5 K/uL 6.9  8.7  8.3  Hemoglobin 12.0 - 15.0 g/dL 14.7  14.8  14.2   Hematocrit 36.0 - 46.0 % 43.0  43.5  41.4   Platelets 150 - 400 K/uL 413  417  302        Latest Ref Rng & Units 05/16/2022    1:54 PM 09/06/2016    3:17 AM 12/16/2015   12:10 PM  CMP  Glucose 70 - 99 mg/dL 85  91  81   BUN 6 - 20 mg/dL _0 Creatinine 0.44 - 1.00 mg/dL 0.95  0.96  1.04   Sodium 135 - 145 mmol/L 137  135  135   Potassium 3.5 - 5.1 mmol/L 3.8  3.5  3.7   Chloride 98 - 111 mmol/L 105  103  103   CO2 22 - 32 mmol/L _1 Calcium 8.9 - 10.3 mg/dL 9.4  9.1  9.5   Total Protein 6.5 - 8.1 g/dL 7.5  7.5  8.0   Total Bilirubin 0.3 - 1.2 mg/dL 0.7  0.5  1.3   Alkaline Phos 38 - 126 U/L 74  51  61   AST 15 - 41 U/L 21  22  62   ALT 0 - 44 U/L _2 ASSESSMENT & PLAN Wynelle Link 32 y.o. female with medical history significant for hyperthyroidism, interstitial cystitis, tachycardia, and panic attacks who present for evaluation of low ferritin and thrombocytosis.   After review of the labs, review of the records, and discussion with the patient the patients findings are most consistent with thrombocytosis of unclear etiology, though suspect iron deficiency.  There are two types of thrombocytosis, essential thrombocytosis and secondary thrombocytosis. Essential thrombocytosis is overproduction of platelets due to a driver mutation. The most common mutation is the JAK2 [id number] (50% of cases), but other causes include CALR, MPL, and mutations of  unclear significance on NGS. Essential thrombocytosis is a myeloproliferative neoplasm which may require cytoreductive therapy do decrease risk of thrombosis. Secondary thrombocytosis can be caused by low iron levels, increased inflammation,  or splenectomy.  Our workup will focus on determining if there is a secondary cause of this patient's thrombocytosis.    #Thrombocytosis, likely 2/2 to Iron Deficiency  # Heavy Menstrual Cycles  --workup to include CBC, CMP, ESR/CRP, and iron panel/ferritin  --patient has no history of splenectomy     --no indication for MPN workup at this time  --RTC in 3 months or sooner if indicated by the above labs.     Orders Placed This Encounter  Procedures   CBC with Differential ([facility] Only)    Standing Status:   Future    Number of Occurrences:   1    Standing Expiration Date:   05/17/2023   CMP ([facility] only)    Standing Status:   Future    Standing Expiration Date:   05/17/2023   Ferritin    Standing Status:   Future    Number of Occurrences:   1    Standing Expiration Date:   05/17/2023   Iron and Iron Binding Capacity ([facility] only)    Standing Status:   Future    Number of Occurrences:   1    Standing Expiration Date:   05/17/2023   Retic Panel    Standing Status:   Future    Number of Occurrences:   1    Standing Expiration Date:  05/17/2023   Sedimentation rate    Standing Status:   Future    Number of Occurrences:   1    Standing Expiration Date:   05/16/2023   C-reactive protein    Standing Status:   Future    Number of Occurrences:   1    Standing Expiration Date:   05/16/2023    All questions were answered. The patient knows to call the clinic with any problems, questions or concerns.  A total of more than 60 minutes were spent on this encounter with face-to-face time and non-face-to-face time, including preparing to see the patient, ordering tests and/or medications, counseling the patient and coordination of care as  outlined above.   Ledell Peoples, MD Department of Hematology/Oncology [place] [facility] Phone: [phone] Pager: [phone] Email: [email or web address]  05/16/2022 4:23 PM

## 2022-05-17 ENCOUNTER — Other Ambulatory Visit: Payer: Self-pay | Admitting: *Deleted

## 2022-05-17 ENCOUNTER — Telehealth: Payer: Self-pay | Admitting: *Deleted

## 2022-05-17 ENCOUNTER — Telehealth: Payer: Self-pay | Admitting: Physician Assistant

## 2022-05-17 MED ORDER — FERROUS SULFATE 325 (65 FE) MG PO TBEC: 325.0000 mg | DELAYED_RELEASE_TABLET | Freq: Three times a day (TID) | ORAL | 3 refills | Status: AC

## 2022-05-17 NOTE — Telephone Encounter (Signed)
-----   Message from Kyra Searles, RN sent at 05/17/2022  1:55 PM EDT -----  ----- Message ----- From: Jaci Standard, MD Sent: 05/17/2022   9:09 AM EDT To: Kyra Searles, RN  Please let Wendy Richards know that her iron levels are low. Her ferritin is 11 (goal >30) and Iron sat 8% (goal 24%). Please call in PO ferrous sulfate 325 mg daily with vitamin C to a pharmacy of her choice. We will see her back in 3 months time to re-evaluate.  ----- Message ----- From: Interface, [facility] [place] Sent: 05/16/2022   2:07 PM EDT To: Jaci Standard, MD

## 2022-05-17 NOTE — Telephone Encounter (Signed)
Called patient per Dr Derek Mound request to notify her of her lab values and Iron deficiency.  Ordered Iron replacement and patient is aware of requirement to take Vitamin C with the Iron supplement.    She will follow up in 3 months.

## 2022-05-17 NOTE — Telephone Encounter (Signed)
Scheduled per 7/12 los, message has been left with pt 

## 2022-05-19 ENCOUNTER — Encounter: Payer: Self-pay | Admitting: Hematology and Oncology

## 2022-05-28 ENCOUNTER — Other Ambulatory Visit: Payer: Self-pay | Admitting: Hematology and Oncology

## 2022-05-28 DIAGNOSIS — M79602 Pain in left arm: Secondary | ICD-10-CM

## 2022-06-07 ENCOUNTER — Ambulatory Visit (HOSPITAL_COMMUNITY): Payer: 59

## 2022-06-08 ENCOUNTER — Telehealth: Payer: Self-pay | Admitting: *Deleted

## 2022-06-08 ENCOUNTER — Ambulatory Visit (HOSPITAL_COMMUNITY)
Admission: RE | Admit: 2022-06-08 | Discharge: 2022-06-08 | Disposition: A | Discharge status: Home / Self Care | Payer: 59 | Source: Ambulatory Visit | Attending: Hematology and Oncology | Admitting: Hematology and Oncology

## 2022-06-08 DIAGNOSIS — M79602 Pain in left arm: Secondary | ICD-10-CM | POA: Diagnosis present

## 2022-06-08 NOTE — Telephone Encounter (Signed)
-----   Message from Jaci Standard, MD sent at 06/08/2022 11:32 AM EDT ----- Please let Wendy Richards know that there is no evidence of DVT or superficial vein thrombosis.  If her arm pain persist she may need to be evaluated by her primary care provider.  ----- Message ----- From: Interface, [facility] Sent: 06/08/2022  11:26 AM EDT To: Jaci Standard, MD

## 2022-06-08 NOTE — Telephone Encounter (Signed)
Tct patient regarding recent US of her arm. No answer but able to leave vm message for pt. Advised that [place] of her arm was negative for a blood clot of any type.Advised to call back with any questions or concerns @ [phone]

## 2022-07-06 ENCOUNTER — Other Ambulatory Visit: Payer: 59

## 2022-07-11 ENCOUNTER — Other Ambulatory Visit: Payer: BC Managed Care – PPO

## 2022-07-11 DIAGNOSIS — E559 Vitamin D deficiency, unspecified: Secondary | ICD-10-CM

## 2022-07-12 LAB — VITAMIN D 25 HYDROXY (VIT D DEFICIENCY, FRACTURES): Vit D, 25-Hydroxy: 80 ng/mL (ref 30–100)

## 2022-08-14 ENCOUNTER — Other Ambulatory Visit: Payer: Self-pay | Admitting: *Deleted

## 2022-08-14 DIAGNOSIS — D75839 Thrombocytosis, unspecified: Secondary | ICD-10-CM

## 2022-08-15 ENCOUNTER — Inpatient Hospital Stay: Payer: 59 | Attending: Hematology and Oncology

## 2022-08-15 ENCOUNTER — Inpatient Hospital Stay: Payer: 59 | Admitting: Physician Assistant
